# Patient Record
Sex: Female | Born: 1990 | Hispanic: No | Marital: Married | Smoking: Never smoker
Health system: Southern US, Community
[De-identification: ages and names within clinical notes are randomized; demographics above are authoritative.]

## PROBLEM LIST (undated history)

## (undated) DIAGNOSIS — E079 Disorder of thyroid, unspecified: Secondary | ICD-10-CM

## (undated) HISTORY — PX: NO PAST SURGERIES: SHX2092

---

## 2017-12-19 ENCOUNTER — Ambulatory Visit (HOSPITAL_COMMUNITY)
Admission: EM | Admit: 2017-12-19 | Discharge: 2017-12-19 | Disposition: A | Discharge status: Home / Self Care | Payer: Self-pay | Attending: Family Medicine | Admitting: Family Medicine

## 2017-12-19 ENCOUNTER — Other Ambulatory Visit: Payer: Self-pay

## 2017-12-19 ENCOUNTER — Encounter (HOSPITAL_COMMUNITY): Payer: Self-pay | Admitting: Emergency Medicine

## 2017-12-19 DIAGNOSIS — M542 Cervicalgia: Secondary | ICD-10-CM

## 2017-12-19 DIAGNOSIS — M62838 Other muscle spasm: Secondary | ICD-10-CM

## 2017-12-19 MED ORDER — PREDNISONE 10 MG (21) PO TBPK: ORAL_TABLET | Freq: Every day | ORAL | 0 refills | Status: DC

## 2017-12-19 NOTE — ED Triage Notes (Signed)
Per pt, c/o L neck pain x6 month, seen a chiropracter without relief. Pain with moving her L arm.

## 2017-12-21 NOTE — ED Provider Notes (Signed)
  [facility]   [id number] 12/19/17 Arrival Time: 1645  ASSESSMENT & PLAN:  1. Neck pain   2. Trapezius muscle spasm     Meds ordered this encounter  Medications  . predniSONE (STERAPRED UNI-PAK 21 TAB) 10 MG (21) TBPK tablet    Sig: Take by mouth daily. Take as directed.    Dispense:  21 tablet    Refill:  0   Continue ibuprofen. Will f/u if not seeing improvement over the next week. Reviewed expectations re: course of current medical issues. Questions answered. Outlined signs and symptoms indicating need for more acute intervention. Patient verbalized understanding. After Visit Summary given.   SUBJECTIVE: History from: patient. Victoria Montgomery is a 26 y.o. female who reports intermittent pain of her L neck and upper back. Event that precipitated these symptoms: none known. Onset of symptoms 6 months ago; on and off since that time. Current symptoms are stiffness in L neck and upper back. Sometimes worsened by movement of LUE. No radicular symptoms. No muscle weakness. Ibuprofen and chiropractic care with mild and temporary help. No trauma/injury reported. No h/o previous back or neck pain.  ROS: As per HPI.   OBJECTIVE:  Vitals:   12/19/17 1753  BP: 105/72  Pulse: 79  Resp: 18  Temp: 97.9 F (36.6 C)  SpO2: 99%    General appearance: alert; no distress Extremities: no cyanosis or edema; symmetrical with no gross deformities Neck: mild tenderness over L neck extending into trapezius muscle; no gross deformities; FROM; no midline tenderness CV: normal extremity capillary refill Skin: warm and dry Neurologic: normal gait; normal symmetric reflexes in all extremities; normal sensation in all extremities Psychological: alert and cooperative; normal mood and affect   No Known Allergies    Social History   Socioeconomic History  . Marital status: Married    Spouse name: Not on file  . Number of children: Not on file  . Years of education: Not on  file  . Highest education level: Not on file  Social Needs  . Financial resource strain: Not on file  . Food insecurity - worry: Not on file  . Food insecurity - inability: Not on file  . Transportation needs - medical: Not on file  . Transportation needs - non-medical: Not on file  Occupational History  . Not on file  Tobacco Use  . Smoking status: Not on file  Substance and Sexual Activity  . Alcohol use: Not on file  . Drug use: Not on file  . Sexual activity: Not on file  Other Topics Concern  . Not on file  Social History Narrative  . Not on file      Mardella LaymanHagler, Gracia Saggese, MD 12/22/17 [phone]

## 2019-07-13 ENCOUNTER — Encounter: Payer: Self-pay | Admitting: Obstetrics

## 2019-07-13 ENCOUNTER — Ambulatory Visit: Payer: BLUE CROSS/BLUE SHIELD | Admitting: Obstetrics and Gynecology

## 2019-07-13 ENCOUNTER — Other Ambulatory Visit: Payer: Self-pay

## 2019-07-13 VITALS — BP 103/68 | HR 87 | Temp 98.5°F | Ht 59.0 in | Wt 113.2 lb

## 2019-07-13 DIAGNOSIS — Z124 Encounter for screening for malignant neoplasm of cervix: Secondary | ICD-10-CM | POA: Diagnosis not present

## 2019-07-13 DIAGNOSIS — Z01419 Encounter for gynecological examination (general) (routine) without abnormal findings: Secondary | ICD-10-CM | POA: Diagnosis not present

## 2019-07-13 NOTE — Progress Notes (Signed)
Subjective:     Victoria Montgomery is a 28 y.o. female P0 with BMI 22 who is here for a comprehensive physical exam. The patient reports no problems. Patient is sexually active seeking pregnancy. Patient reports actively trying to conceive for the past 11 months without success. Patient describes a monthly 5 day cycle. She also reports some urinary frequency. Patient denies pelvic pain or abnormal discharge  History reviewed. No pertinent past medical history. History reviewed. No pertinent surgical history. Family History  Problem Relation Age of Onset  . Diabetes Mother     Social History   Socioeconomic History  . Marital status: Married    Spouse name: Not on file  . Number of children: Not on file  . Years of education: Not on file  . Highest education level: Not on file  Occupational History  . Occupation: Unemployed  Social Needs  . Financial resource strain: Not on file  . Food insecurity    Worry: Not on file    Inability: Not on file  . Transportation needs    Medical: Not on file    Non-medical: Not on file  Tobacco Use  . Smoking status: Never Smoker  . Smokeless tobacco: Never Used  Substance and Sexual Activity  . Alcohol use: Never    Frequency: Never  . Drug use: Never  . Sexual activity: Yes    Birth control/protection: None  Lifestyle  . Physical activity    Days per week: Not on file    Minutes per session: Not on file  . Stress: Not on file  Relationships  . Social Musicianconnections    Talks on phone: Not on file    Gets together: Not on file    Attends religious service: Not on file    Active member of club or organization: Not on file    Attends meetings of clubs or organizations: Not on file    Relationship status: Not on file  . Intimate partner violence    Fear of current or ex partner: Not on file    Emotionally abused: Not on file    Physically abused: Not on file    Forced sexual activity: Not on file  Other Topics Concern  . Not on file   Social History Narrative  . Not on file   Health Maintenance  Topic Date Due  . HIV Screening  10/27/2008  . TETANUS/TDAP  10/27/2012  . PAP-Cervical Cytology Screening  10/27/2014  . PAP SMEAR-Modifier  10/27/2014  . INFLUENZA VACCINE  07/23/2019       Review of Systems Pertinent items are noted in HPI.   Objective:  Blood pressure 103/68, pulse 87, temperature 98.5 F (36.9 C), height 4\' 11"  (1.499 m), weight 113 lb 3.2 oz (51.3 kg), last menstrual period 07/06/2019.     GENERAL: Well-developed, well-nourished female in no acute distress.  HEENT: Normocephalic, atraumatic. Sclerae anicteric.  NECK: Supple. Normal thyroid.  LUNGS: Clear to auscultation bilaterally.  HEART: Regular rate and rhythm. BREASTS: Symmetric in size. No palpable masses or lymphadenopathy, skin changes, or nipple drainage. ABDOMEN: Soft, nontender, nondistended. No organomegaly. PELVIC: Normal external female genitalia. Vagina is pink and rugated.  Normal discharge. Normal appearing cervix. Uterus is normal in size. No adnexal mass or tenderness. EXTREMITIES: No cyanosis, clubbing, or edema, 2+ distal pulses.    Assessment:    Healthy female exam.      Plan:    Pap smear collected Urine culture collected Pelvic ultrasound ordered Patient will be  contacted with abnormal results Discussed and reviewed timing of ovulation with intercourse and the use of ovulation predictor kits See After Visit Summary for Counseling Recommendations

## 2019-07-13 NOTE — Progress Notes (Signed)
New GYN presents for AEX/PAP and infertility problems.  C/o trying to conceive x 11 months, she has a period every month and has never been on [facility].  This is her first PAP Smear.  Her spouse has not have a Sperm Count Test done.

## 2019-07-14 LAB — CYTOLOGY - PAP: Diagnosis: NEGATIVE

## 2019-07-15 LAB — URINE CULTURE

## 2019-07-15 MED ORDER — PENICILLIN V POTASSIUM 500 MG PO TABS: 500.0000 mg | ORAL_TABLET | Freq: Four times a day (QID) | ORAL | 0 refills | Status: DC

## 2019-07-15 NOTE — Addendum Note (Signed)
Addended by: Mora Bellman on: 07/15/2019 10:02 AM   Modules accepted: Orders

## 2019-07-22 ENCOUNTER — Other Ambulatory Visit: Payer: BLUE CROSS/BLUE SHIELD

## 2019-07-27 ENCOUNTER — Ambulatory Visit
Admission: RE | Admit: 2019-07-27 | Discharge: 2019-07-27 | Disposition: A | Discharge status: Home / Self Care | Payer: BLUE CROSS/BLUE SHIELD | Source: Ambulatory Visit | Attending: Obstetrics and Gynecology | Admitting: Obstetrics and Gynecology

## 2019-07-27 DIAGNOSIS — Z01419 Encounter for gynecological examination (general) (routine) without abnormal findings: Secondary | ICD-10-CM

## 2019-09-21 ENCOUNTER — Ambulatory Visit (INDEPENDENT_AMBULATORY_CARE_PROVIDER_SITE_OTHER): Payer: BLUE CROSS/BLUE SHIELD

## 2019-09-21 ENCOUNTER — Other Ambulatory Visit: Payer: Self-pay

## 2019-09-21 DIAGNOSIS — Z3401 Encounter for supervision of normal first pregnancy, first trimester: Secondary | ICD-10-CM | POA: Diagnosis not present

## 2019-09-21 DIAGNOSIS — Z3400 Encounter for supervision of normal first pregnancy, unspecified trimester: Secondary | ICD-10-CM | POA: Insufficient documentation

## 2019-09-21 LAB — POCT URINE PREGNANCY: Preg Test, Ur: POSITIVE — AB

## 2019-09-21 MED ORDER — BLOOD PRESSURE KIT DEVI: 1.0000 | 0 refills | Status: DC

## 2019-09-21 NOTE — Progress Notes (Signed)
PRENATAL INTAKE SUMMARY  Ms. Younce presents today New OB Nurse Interview.  OB History    Gravida  1   Para  0   Term  0   Preterm  0   AB  0   Living  0     SAB  0   TAB  0   Ectopic  0   Multiple  0   Live Births  0          I have reviewed the patient's medical, obstetrical, social, and family histories, medications, and available lab results.  SUBJECTIVE She has no unusual complaints and complains of abdominal pain in the pelvic area, hair loss and itching.  OBJECTIVE Initial Physical Exam (New OB)  GENERAL APPEARANCE: alert, well appearing   ASSESSMENT Normal pregnancy  PLAN Prenatal care at [facility].  NOB appt scheduled.

## 2019-09-22 ENCOUNTER — Telehealth: Payer: Self-pay

## 2019-09-22 ENCOUNTER — Other Ambulatory Visit: Payer: Self-pay

## 2019-09-22 ENCOUNTER — Encounter (HOSPITAL_COMMUNITY): Payer: Self-pay | Admitting: *Deleted

## 2019-09-22 ENCOUNTER — Inpatient Hospital Stay (HOSPITAL_COMMUNITY)
Admission: AD | Admit: 2019-09-22 | Discharge: 2019-09-22 | Disposition: A | Discharge status: Home / Self Care | Payer: BLUE CROSS/BLUE SHIELD | Attending: Obstetrics & Gynecology | Admitting: Obstetrics & Gynecology

## 2019-09-22 ENCOUNTER — Inpatient Hospital Stay (HOSPITAL_COMMUNITY): Payer: BLUE CROSS/BLUE SHIELD

## 2019-09-22 DIAGNOSIS — Z3A01 Less than 8 weeks gestation of pregnancy: Secondary | ICD-10-CM | POA: Insufficient documentation

## 2019-09-22 DIAGNOSIS — O98811 Other maternal infectious and parasitic diseases complicating pregnancy, first trimester: Secondary | ICD-10-CM | POA: Insufficient documentation

## 2019-09-22 DIAGNOSIS — B3731 Acute candidiasis of vulva and vagina: Secondary | ICD-10-CM

## 2019-09-22 DIAGNOSIS — O98812 Other maternal infectious and parasitic diseases complicating pregnancy, second trimester: Secondary | ICD-10-CM

## 2019-09-22 DIAGNOSIS — M545 Low back pain: Secondary | ICD-10-CM | POA: Diagnosis present

## 2019-09-22 DIAGNOSIS — B373 Candidiasis of vulva and vagina: Secondary | ICD-10-CM | POA: Diagnosis not present

## 2019-09-22 DIAGNOSIS — R109 Unspecified abdominal pain: Secondary | ICD-10-CM | POA: Diagnosis not present

## 2019-09-22 LAB — COMPREHENSIVE METABOLIC PANEL
ALT: 16 U/L (ref 0–44)
AST: 15 U/L (ref 15–41)
Albumin: 4 g/dL (ref 3.5–5.0)
Alkaline Phosphatase: 61 U/L (ref 38–126)
Anion gap: 8 (ref 5–15)
BUN: 5 mg/dL — ABNORMAL LOW (ref 6–20)
CO2: 24 mmol/L (ref 22–32)
Calcium: 9.3 mg/dL (ref 8.9–10.3)
Chloride: 105 mmol/L (ref 98–111)
Creatinine, Ser: 0.66 mg/dL (ref 0.44–1.00)
GFR calc Af Amer: >60 mL/min (ref >60)
GFR calc non Af Amer: >60 mL/min (ref >60)
Glucose, Bld: 95 mg/dL (ref 70–99)
Potassium: 3.8 mmol/L (ref 3.5–5.1)
Sodium: 137 mmol/L (ref 135–145)
Total Bilirubin: 0.4 mg/dL (ref 0.3–1.2)
Total Protein: 7.2 g/dL (ref 6.5–8.1)

## 2019-09-22 LAB — URINALYSIS, ROUTINE W REFLEX MICROSCOPIC
Bacteria, UA: NONE SEEN
Bilirubin Urine: NEGATIVE
Glucose, UA: NEGATIVE mg/dL
Hgb urine dipstick: NEGATIVE
Ketones, ur: NEGATIVE mg/dL
Nitrite: NEGATIVE
Protein, ur: NEGATIVE mg/dL
Specific Gravity, Urine: 1.009 (ref 1.005–1.030)
pH: 8 (ref 5.0–8.0)

## 2019-09-22 LAB — CBC
HCT: 38.8 % (ref 36.0–46.0)
Hemoglobin: 12.7 g/dL (ref 12.0–15.0)
MCH: 26.1 pg (ref 26.0–34.0)
MCHC: 32.7 g/dL (ref 30.0–36.0)
MCV: 79.7 fL — ABNORMAL LOW (ref 80.0–100.0)
Platelets: 358 10*3/uL (ref 150–400)
RBC: 4.87 MIL/uL (ref 3.87–5.11)
RDW: 13.8 % (ref 11.5–15.5)
WBC: 9.8 10*3/uL (ref 4.0–10.5)
nRBC: 0 % (ref 0.0–0.2)

## 2019-09-22 LAB — WET PREP, GENITAL
Clue Cells Wet Prep HPF POC: NONE SEEN
Sperm: NONE SEEN
Trich, Wet Prep: NONE SEEN

## 2019-09-22 LAB — ABO/RH: ABO/RH(D): B POS

## 2019-09-22 LAB — HCG, QUANTITATIVE, PREGNANCY: hCG, Beta Chain, Quant, S: 2481 m[IU]/mL — ABNORMAL HIGH (ref <5)

## 2019-09-22 MED ORDER — CYCLOBENZAPRINE HCL 10 MG PO TABS: 10.0000 mg | ORAL_TABLET | Freq: Once | ORAL | Status: DC

## 2019-09-22 MED ORDER — CYCLOBENZAPRINE HCL 10 MG PO TABS: 10.0000 mg | ORAL_TABLET | Freq: Two times a day (BID) | ORAL | 0 refills | Status: DC | PRN

## 2019-09-22 MED ORDER — BLOOD PRESSURE KIT DEVI: 1.0000 | 0 refills | Status: DC

## 2019-09-22 MED ORDER — TERCONAZOLE 0.4 % VA CREA: 1.0000 | TOPICAL_CREAM | Freq: Every day | VAGINAL | 0 refills | Status: DC

## 2019-09-22 NOTE — MAU Provider Note (Addendum)
History    Patient Victoria Montgomery is a 28 y.o. G1P0000 At 45w4dhere with complaints of low back pain for a week and two drops of blood on her toilet paper yesterday.   She denies cramping, NV, SOB, chest pain, fever, fatigue or heavy vaginal bleeding.  She states that back pain is her main complaint; it is a 7/10. She only came in because she had some spotting too and she thought she might be having a miscarriag. The back pain plus the spotting made her very upset and want to come to MAU.  CSN: [id number] Arrival date and time: 09/22/19 1149   None     Chief Complaint  Patient presents with  . Vaginal Bleeding  . Abdominal Pain  . Back Pain  . Vaginal Itching   Vaginal Bleeding The patient's primary symptoms include vaginal bleeding. The patient's pertinent negatives include no vaginal discharge. This is a new problem. The current episode started yesterday. The problem has been resolved. She is pregnant. Associated symptoms include back pain. Pertinent negatives include no abdominal pain, dysuria, urgency or vomiting. The vaginal bleeding is spotting. She has not been passing clots. She has not been passing tissue. Nothing aggravates the symptoms. She has tried nothing for the symptoms.  She also endorses low back pain that has been going on for a week but was made much worse by a long car ride two days ago[place] Ever since then she has felt sore. She denies pain with urination, blood in urine, urgency, frequency. She has tried Tylenol but it has not helped.   OB History    Gravida  1   Para  0   Term  0   Preterm  0   AB  0   Living  0     SAB  0   TAB  0   Ectopic  0   Multiple  0   Live Births  0           History reviewed. No pertinent past medical history.  Past Surgical History:  Procedure Laterality Date  . NO PAST SURGERIES      Family History  Problem Relation Age of Onset  . Diabetes Mother      Social History   Tobacco Use  . Smoking status: Never Smoker  . Smokeless tobacco: Never Used  Substance Use Topics  . Alcohol use: Never    Frequency: Never  . Drug use: Never    Allergies: No Known Allergies  Medications Prior to Admission  Medication Sig Dispense Refill Last Dose  . Prenatal Vit-Fe Fumarate-FA (PRENATAL MULTIVITAMIN) TABS tablet Take 1 tablet by mouth daily at 12 noon.     . Blood Pressure Monitoring (BLOOD PRESSURE KIT) DEVI 1 kit by Does not apply route once a week. Check BP Weekly. Large cuff  EX O090.0 1 kit 0   . Multiple Vitamin (MULTIVITAMIN) capsule Take by mouth.     . penicillin v potassium (VEETID) 500 MG tablet Take 1 tablet (500 mg total) by mouth 4 (four) times daily. (Patient not taking: Reported on 09/21/2019) 28 tablet 0   . predniSONE (STERAPRED UNI-PAK 21 TAB) 10 MG (21) TBPK tablet Take by mouth daily. Take as directed. (Patient not taking: Reported on 07/13/2019) 21 tablet 0     Review of Systems  Constitutional: Negative.   HENT: Negative.   Respiratory: Negative.   Gastrointestinal: Negative for abdominal  pain and vomiting.  Genitourinary: Positive for vaginal bleeding. Negative for decreased urine volume, difficulty urinating, dyspareunia, dysuria, urgency, vaginal discharge and vaginal pain.  Musculoskeletal: Positive for back pain.   Physical Exam   Blood pressure 100/66, pulse 99, temperature 98.2 F (36.8 C), temperature source Oral, resp. rate 16, height _0  (1.473 m), weight 51.8 kg, last menstrual period 08/08/2019, SpO2 100 %.  Physical Exam  Constitutional: She is oriented to person, place, and time. She appears well-developed.  HENT:  Head: Normocephalic.  Eyes: Pupils are equal, round, and reactive to light.  Neck: Normal range of motion.  Respiratory: Effort normal.  GI: Soft.  Genitourinary:    Vagina normal.     Genitourinary Comments: NEFG; no blood in discharge. Clumpy white discharge in the vagina, no CMT,  suprapubic or adnexal tenderness.    Musculoskeletal: Normal range of motion.  Neurological: She is alert and oriented to person, place, and time.  Skin: Skin is warm and dry.    MAU Course  Procedures  MDM -ectopic rule out done -Blood type is B positive -US shows gestational sac but no FP or yolk sac yet -discharge in vagina consistent with yeast -UA is clear; unlikely that back pain is due to UTI, most likely MSK and from long car ride.   -beta is 2500.  Assessment and Plan   1. Vaginal yeast infection   2. Abdominal pain    2. Reviewed that back pain is most likely MSK; offered Flexeril in MAU but patient declined, she wants to be discharged and take at home. Will send home with RX for Terazol as well.   3. Explained her [place] results in detail with patient and sister-in- law; she understands that we are still concerned about an ectopic pregnancy and that we want to make sure her pregnancy is not in her tubes. She will return on Saturday afternoon (48 hours) for a follow up [place].   4. Strict ectopic precautions were given; patient and sister-in-law agreed to come back if any increase in pain or bleeding.  Mervyn Skeeters Izear Pine 09/22/2019, 4:41 PM

## 2019-09-22 NOTE — Telephone Encounter (Signed)
Patient called triage line during lunch yesterday and states that she has been having spotting, lower abdominal pain, and lower back pain for the past 2 days. She denies having any dysuria, urinary frequency. Patient states that she is very nervous due to this being her first pregnancy. Patient advised to go to MAU to be evaluated to make sure that there is nothing else going on.

## 2019-09-22 NOTE — Discharge Instructions (Signed)
-  Come back on Saturday, October 3, after 2 pm for repeat blood work. Return to MAU if you develop strong, constant abdominal pain or heavy bleeding.  Spotting is ok, and back pain is normal right now.    Ectopic Pregnancy  An ectopic pregnancy happens when a fertilized egg grows outside the womb (uterus). The fertilized egg cannot stay alive outside of the womb. This problem often happens in a fallopian tube. It is often caused by damage to the tube. If this problem is found early, you may be treated with medicine that stops the egg from growing. If your tube tears or bursts open (ruptures), you will bleed inside. Often, there is very bad pain in the lower belly. This is an emergency. You will need surgery. Get help right away. Follow these instructions at home: After being treated with medicine or surgery:  Rest and limit your activity for as long as told by your doctor.  Until your doctor says that it is safe: ? Do not lift anything that is heavier than 10 lb (4.5 kg) or the limit that your doctor tells you. ? Avoid exercise and any movement that takes a lot of effort.  To prevent problems when pooping (constipation): ? Eat a healthy diet. This includes:  Fruits.  Vegetables.  Whole grains. ? Drink 6-8 glasses of water a day. Contact a doctor if: Get help right away if:  You have sudden and very bad pain in your belly.  You have very bad pain in your shoulders or neck.  You have pain that gets worse and is not helped by medicine.  You have: ? A fever or chills. ? Vaginal bleeding. ? Redness or swelling at the site of a surgical cut (incision).  You feel sick to your stomach (nauseous) or you throw up (vomit).  You feel dizzy or weak.  You feel light-headed or you pass out (faint). Summary  An ectopic pregnancy happens when a fertilized egg grows outside the womb (uterus).  If this problem is found early, you may be treated with medicine that stops the egg from  growing.  If your tube tears or bursts open (ruptures), you will need surgery. This is an emergency. Get help right away. This information is not intended to replace advice given to you by your health care provider. Make sure you discuss any questions you have with your health care provider. Document Released: 03/06/2009 Document Revised: 11/20/2017 Document Reviewed: 01/01/2017 Elsevier Patient Education  2020 [employer].

## 2019-09-22 NOTE — MAU Note (Signed)
Having too much back pain.  Been going on for 36 hrs.  Saw 2 spots of blood yesterday.  Has some vaginal itching sometimes and some pain in the lower abd.

## 2019-09-22 NOTE — Addendum Note (Signed)
Addended by: Tamela Oddi on: 09/22/2019 10:03 AM   Modules accepted: Orders

## 2019-09-23 ENCOUNTER — Other Ambulatory Visit: Payer: Self-pay | Admitting: Student

## 2019-09-23 LAB — GC/CHLAMYDIA PROBE AMP (~~LOC~~) NOT AT ARMC
Chlamydia: NEGATIVE
Neisseria Gonorrhea: NEGATIVE

## 2019-09-24 ENCOUNTER — Inpatient Hospital Stay (HOSPITAL_COMMUNITY)
Admission: AD | Admit: 2019-09-24 | Discharge: 2019-09-24 | Disposition: A | Discharge status: Home / Self Care | Payer: BLUE CROSS/BLUE SHIELD | Attending: Obstetrics and Gynecology | Admitting: Obstetrics and Gynecology

## 2019-09-24 ENCOUNTER — Other Ambulatory Visit: Payer: Self-pay

## 2019-09-24 DIAGNOSIS — O009 Unspecified ectopic pregnancy without intrauterine pregnancy: Secondary | ICD-10-CM | POA: Diagnosis present

## 2019-09-24 DIAGNOSIS — Z3A01 Less than 8 weeks gestation of pregnancy: Secondary | ICD-10-CM | POA: Insufficient documentation

## 2019-09-24 LAB — CBC WITH DIFFERENTIAL/PLATELET
Abs Immature Granulocytes: 0.04 10*3/uL (ref 0.00–0.07)
Basophils Absolute: 0 10*3/uL (ref 0.0–0.1)
Basophils Relative: 0 %
Eosinophils Absolute: 0.2 10*3/uL (ref 0.0–0.5)
Eosinophils Relative: 2 %
HCT: 37.8 % (ref 36.0–46.0)
Hemoglobin: 12.3 g/dL (ref 12.0–15.0)
Immature Granulocytes: 0 %
Lymphocytes Relative: 32 %
Lymphs Abs: 3.1 10*3/uL (ref 0.7–4.0)
MCH: 26.3 pg (ref 26.0–34.0)
MCHC: 32.5 g/dL (ref 30.0–36.0)
MCV: 80.8 fL (ref 80.0–100.0)
Monocytes Absolute: 0.5 10*3/uL (ref 0.1–1.0)
Monocytes Relative: 5 %
Neutro Abs: 5.8 10*3/uL (ref 1.7–7.7)
Neutrophils Relative %: 61 %
Platelets: 346 10*3/uL (ref 150–400)
RBC: 4.68 MIL/uL (ref 3.87–5.11)
RDW: 13.8 % (ref 11.5–15.5)
WBC: 9.6 10*3/uL (ref 4.0–10.5)
nRBC: 0 % (ref 0.0–0.2)

## 2019-09-24 LAB — COMPREHENSIVE METABOLIC PANEL
ALT: 16 U/L (ref 0–44)
AST: 18 U/L (ref 15–41)
Albumin: 3.8 g/dL (ref 3.5–5.0)
Alkaline Phosphatase: 60 U/L (ref 38–126)
Anion gap: 11 (ref 5–15)
BUN: 5 mg/dL — ABNORMAL LOW (ref 6–20)
CO2: 22 mmol/L (ref 22–32)
Calcium: 8.8 mg/dL — ABNORMAL LOW (ref 8.9–10.3)
Chloride: 101 mmol/L (ref 98–111)
Creatinine, Ser: 0.59 mg/dL (ref 0.44–1.00)
GFR calc Af Amer: >60 mL/min (ref >60)
GFR calc non Af Amer: >60 mL/min (ref >60)
Glucose, Bld: 115 mg/dL — ABNORMAL HIGH (ref 70–99)
Potassium: 3.5 mmol/L (ref 3.5–5.1)
Sodium: 134 mmol/L — ABNORMAL LOW (ref 135–145)
Total Bilirubin: 0.6 mg/dL (ref 0.3–1.2)
Total Protein: 6.9 g/dL (ref 6.5–8.1)

## 2019-09-24 LAB — HCG, QUANTITATIVE, PREGNANCY: hCG, Beta Chain, Quant, S: 2494 m[IU]/mL — ABNORMAL HIGH (ref <5)

## 2019-09-24 MED ORDER — METHOTREXATE FOR ECTOPIC PREGNANCY
50.0000 mg/m2 | Freq: Once | INTRAMUSCULAR | Status: AC
  Administered 2019-09-24: 20:00:00 75 mg via INTRAMUSCULAR
  Filled 2019-09-24: qty 1

## 2019-09-24 NOTE — Discharge Instructions (Signed)
Methotrexate Treatment for an Ectopic Pregnancy  Methotrexate is a medicine that treats an ectopic pregnancy. An ectopic pregnancy is a pregnancy in which the fetus develops outside the uterus. This kind of pregnancy can be dangerous. Methotrexate works by stopping the growth of the fertilized egg. It also helps your body absorb tissue from the egg. This takes between 2-6 weeks. Most ectopic pregnancies can be successfully treated with methotrexate if they are diagnosed early. Tell a health care provider about:  Any allergies you have.  All medicines you are taking, including vitamins, herbs, eye drops, creams, and over-the-counter medicines.  Any medical conditions you have. What are the risks? Generally, this is a safe treatment. However, problems may occur, including:  Nausea or vomiting or both.  Vaginal bleeding or spotting.  Diarrhea.  Abdominal cramping.  Dizziness or feeling lightheaded.  Mouth sores.  Swelling or irritation of the lining of your lungs (pneumonitis).  Liver damage.  Hair loss. There is a risk that methotrexate treatment will fail and your pregnancy will continue. There is also a risk that the ectopic pregnancy might rupture while you are using this medicine. What happens before the procedure?  Liver tests, kidney tests, and a complete blood test will be done.  Blood tests will be done to measure the pregnancy hormone levels and to determine your blood type.  If you are Rh-negative and the father is Rh-positive or his Rh type is not known, you will be given a Rho (D) immune globulin shot. What happens during the procedure? Your health care provider may give you methotrexate by injection or in the form of a pill. Methotrexate may be given as a single dose of medicine or a series of doses, depending on your response to the treatment.  Methotrexate injections will be given by your health care provider. This is the most common way that methotrexate is used  to treat an ectopic pregnancy.  If you are prescribed oral methotrexate, it is very important that you follow your health care provider's instructions on how to take oral methotrexate. Additional medicines may be needed to manage an ectopic pregnancy. The procedure may vary among health care providers and hospitals. What happens after the procedure?  You may have abdominal cramping, vaginal bleeding, and fatigue.  Blood tests will be taken at timed intervals for several days or weeks to check your pregnancy hormone levels. The blood tests will be done until the pregnancy hormone can no longer be detected in the blood.  You may need to have a surgical procedure to remove the ectopic pregnancy if methotrexate treatment fails.  Follow instructions from your health care provider on how and when to report any symptoms that may indicate a ruptured ectopic pregnancy. Summary  Methotrexate is a medicine that treats an ectopic pregnancy.  Methotrexate may be given in a single dose or a series of doses over time.  Blood tests will be taken at timed intervals for several days or weeks to check your pregnancy hormone levels. The blood tests will be done until no more pregnancy hormone is detected in the blood.  There is a risk that methotrexate treatment will fail and your pregnancy will continue. There is also a risk that the ectopic pregnancy might rupture while you are using this medicine. This information is not intended to replace advice given to you by your health care provider. Make sure you discuss any questions you have with your health care provider. Document Released: 12/02/2001 Document Revised: 11/20/2017 Document Reviewed:  01/27/2017 Elsevier Patient Education  [place].     Methotrexate Treatment for an Ectopic Pregnancy, Care After This sheet gives you information about how to care for yourself after your procedure. Your health care provider may also give you more  specific instructions. If you have problems or questions, contact your health care provider. What can I expect after the procedure? After the procedure, it is common to have:  Abdominal cramping.  Vaginal bleeding.  Fatigue.  Nausea.  Vomiting.  Diarrhea. Blood tests will be taken at timed intervals for several days or weeks to check your pregnancy hormone levels. The blood tests will be done until the pregnancy hormone can no longer be detected in the blood. Follow these instructions at home: Activity  Do not have sex until your health care provider approves.  Limit activities that take a lot of effort as told by your health care provider. Medicines  Take over the counter and prescription medicines only as told by your health care provider.  Do not take aspirin, ibuprofen, naproxen, or any other NSAIDs.  Do not take folic acid, prenatal vitamins, or other vitamins that contain folic acid. General instructions   Do not drink alcohol.  Follow instructions from your health care provider on how and when to report any symptoms that may indicate a ruptured ectopic pregnancy.  Keep all follow-up visits as told by your health care provider. This is important. Contact a health care provider if:  You have persistent nausea and vomiting.  You have persistent diarrhea.  You are having a reaction to the medicine, such as: ? Tiredness. ? Skin rash. ? Hair loss. Get help right away if:  Your abdominal or pelvic pain gets worse.  You have more vaginal bleeding.  You feel light-headed or you faint.  You have shortness of breath.  Your heart rate increases.  You develop a cough.  You have chills.  You have a fever. Summary  After the procedure, it is common to have symptoms of abdominal cramping, vaginal bleeding and fatigue. You may also experience other symptoms.  Blood tests will be taken at timed intervals for several days or weeks to check your pregnancy  hormone levels. The blood tests will be done until the pregnancy hormone can no longer be detected in the blood.  Limit strenuous activity as told by your health care provider.  Follow instructions from your health care provider on how and when to report any symptoms that may indicate a ruptured ectopic pregnancy. This information is not intended to replace advice given to you by your health care provider. Make sure you discuss any questions you have with your health care provider. Document Released: 11/27/2011 Document Revised: 11/20/2017 Document Reviewed: 01/27/2017 Elsevier Patient Education  2020 [employer].

## 2019-09-24 NOTE — MAU Note (Signed)
Victoria Montgomery is a 28 y.o. at [redacted]w[redacted]d here in MAU reporting: here for follow up hcg. Today she saw 2 spots of blood. Is having some back pain, is about the same as when she was here the other day.  Pain score: 6/10  Vitals:   09/24/19 1445  BP: 102/60  Pulse: 96  Resp: 17  Temp: 98.1 F (36.7 C)  SpO2: 100%      Lab orders placed from triage: hcg

## 2019-09-24 NOTE — MAU Note (Signed)
Pt and SO come to desk and state they are waiting for labs still. This RN informed pt and SO that CMP is still pending. Pt and SO states that they will "be back in some time"

## 2019-09-24 NOTE — MAU Provider Note (Signed)
Subjective:  Victoria Montgomery is a 28 y.o. G1P0000 at [redacted]w[redacted]d who presents today for FU BHCG. She was seen on 09/22/2019. Results from that day show no IUP on [place], and HCG 2,481. She denies vaginal bleeding. She denies abdominal or pelvic pain.  Objective:  Physical Exam  Nursing note and vitals reviewed. Patient Vitals for the past 24 hrs:  BP Temp Temp src Pulse Resp SpO2  09/24/19 1445 102/60 98.1 F (36.7 C) Oral 96 17 100 %   Constitutional: She is oriented to person, place, and time. She appears well-developed and well-nourished. No distress.  HENT:  Head: Normocephalic.  Cardiovascular: Normal rate.  Respiratory: Effort normal.  GI: Soft. There is no tenderness.  Neurological: She is alert and oriented to person, place, and time. Skin: Skin is warm and dry.  Psychiatric: She has a normal mood and affect.   Results for orders placed or performed during the hospital encounter of 09/24/19 (from the past 24 hour(s))  hCG, quantitative, pregnancy     Status: Abnormal   Collection Time: 09/24/19  3:01 PM  Result Value Ref Range   hCG, Beta Chain, Quant, S 2,494 (H) <5 mIU/mL  Comprehensive metabolic panel     Status: Abnormal   Collection Time: 09/24/19  3:01 PM  Result Value Ref Range   Sodium 134 (L) 135 - 145 mmol/L   Potassium 3.5 3.5 - 5.1 mmol/L   Chloride 101 98 - 111 mmol/L   CO2 22 22 - 32 mmol/L   Glucose, Bld 115 (H) 70 - 99 mg/dL   BUN 5 (L) 6 - 20 mg/dL   Creatinine, Ser 4.08 0.44 - 1.00 mg/dL   Calcium 8.8 (L) 8.9 - 10.3 mg/dL   Total Protein 6.9 6.5 - 8.1 g/dL   Albumin 3.8 3.5 - 5.0 g/dL   AST 18 15 - 41 U/L   ALT 16 0 - 44 U/L   Alkaline Phosphatase 60 38 - 126 U/L   Total Bilirubin 0.6 0.3 - 1.2 mg/dL   GFR calc non Af Amer >60 >60 mL/min   GFR calc Af Amer >60 >60 mL/min   Anion gap 11 5 - 15  CBC with Differential/Platelet     Status: None   Collection Time: 09/24/19  5:13 PM  Result Value Ref Range   WBC 9.6 4.0 - 10.5 K/uL   RBC 4.68 3.87 - 5.11  MIL/uL   Hemoglobin 12.3 12.0 - 15.0 g/dL   HCT 14.4 81.8 - 56.3 %   MCV 80.8 80.0 - 100.0 fL   MCH 26.3 26.0 - 34.0 pg   MCHC 32.5 30.0 - 36.0 g/dL   RDW 14.9 70.2 - 63.7 %   Platelets 346 150 - 400 K/uL   nRBC 0.0 0.0 - 0.2 %   Neutrophils Relative % 61 %   Neutro Abs 5.8 1.7 - 7.7 K/uL   Lymphocytes Relative 32 %   Lymphs Abs 3.1 0.7 - 4.0 K/uL   Monocytes Relative 5 %   Monocytes Absolute 0.5 0.1 - 1.0 K/uL   Eosinophils Relative 2 %   Eosinophils Absolute 0.2 0.0 - 0.5 K/uL   Basophils Relative 0 %   Basophils Absolute 0.0 0.0 - 0.1 K/uL   Immature Granulocytes 0 %   Abs Immature Granulocytes 0.04 0.00 - 0.07 K/uL    Assessment/Plan: -Pregnancy of unknown location -HCG did not rise appropriately -Called and spoke with Dr. Jolayne Panther @1612  to review abnormal rise in hCG and results from 09/22/2019 showing  GS only, but no yolk sac or fetal pole with normal ovaries. Per Dr. Elly Modena, offer MTX. -Discussed with pt MTX as preferred intervention, followed by surgery, followed by expectant management. -Discussed with patient that if expectant management is elected, there is a possibility of rupture of the ectopic pregnancy. Discussed the life-threatening nature of ruptured ectopic pregnancy necessitating emergency surgery and discussed that MTX was preferred treatment modality at this time. Pt states she needs to call her husband to discuss and was escorted back to the waiting room to make her phone call after discussion with provider. -Pt called from waiting room @434PM , no one in lobby, pt did not respond. -Pt called from waiting room @445PM , pt not in lobby. Per front desk, they state the patient was crying and left the waiting room. They anticipated her return, but state she left around 425PM and has not returned. -The front desk called around 450PM stating that the patient had returned to the waiting room with her husband and was now ready to be seen. Discussed the situation in  detail and patient and patient's husband's questions were asked and answered in detail. Pt states she needs to discuss again with her husband and the provider stepped out of the room. -Upon return to the room, patient and husband elect to have the MTX injection today. Pt advised will need additional labs and should be prepared to remain in hospital for approximately 2hrs, but that a specific timeframe cannot be guaranteed. Pt and husband agree they can stay for the duration of the treatment. -notified by RN around 720PM that patient told RN she and her husband would be leaving MAU and return "in a little bit." CMP pending at this time. -pt called from waiting room @733PM  when lab resulted, pt and husband brought to triage room and reviewed below information about MTX injection.  The risks of methotrexate were reviewed including failure requiring repeat dosing or eventual surgery. She understands that methotrexate involves frequent return visits to monitor lab values and that she remains at risk of ectopic rupture until her beta is less than assay. ?The patient opts to proceed with methotrexate.  She has no history of hepatic or renal dysfunction, has normal BUN/Cr/LFT's/platelets.  She is felt to be reliable for follow-up. Side effects of photosensitivity & GI upset were discussed.  She knows to avoid direct sunlight and abstain from alcohol, NSAIDs and sexual intercourse for two weeks. She was counseled to discontinue any MVI with folic acid. ?She understands to follow up on D4 (09/27/2019) and D7 (09/30/2019) for repeat BHCG and was given the instruction sheet. ?Strict ectopic precautions were reviewed, the patient knows to call with any abdominal pain, vomiting, fainting, or any concerns with her health.  Day 0/1 Day 4 Day 7  Sunday Wednesday Saturday  Monday Thursday Sunday  Tuesday Friday Monday  Wednesday Saturday Tuesday  Thursday Sunday Wednesday  Friday Monday Thursday  Saturday Tuesday  Friday    Methotrexate Treatment Protocol for Ectopic Pregnancy  X Pretreatment testing and instructions  X hCG concentration (2,494 - Day 0) X Transvaginal ultrasound - completed 09/22/2019 (Tiny gestational sac is noted but no yolk sac, fetal pole, or cardiac activity yet visualized) X Blood group and Rh(D) typing - B Positive X Complete blood count - WNL X Liver and renal function tests - no abnormalities requiring treatment, serum creatinine 0.59, AST/ALT 18/16  X Discontinue folic acid supplements  X Counsel patient to avoid NSAIDs, recommend acetaminophen if an analgesic is needed  X Advise patient to refrain from sexual intercourse and strenuous exercise  Treatment day  Single dose protocol   1  hCG.  Administer Methotrexate 50 mg/m2 body surface area IM  4  hCG - message sent to [facility] to schedule pt for lab draw Tuesday 09/27/2019  7  [facility] Friday 09/30/2019 If <15 percent hCG decline from day 4 to 7, give additional dose of methotrexate 50 mg/m2 IM  If ?15 percent hCG decline from day 4 to 7, draw hCG weekly until undetectable  14  hCG  If <15 percent hCG decline from day 7 to 14, give additional dose of methotrexate 50 mg/m2 IM  If ?15 percent hCG decline from day 7 to 14, check hCG weekly until undetectable  21 and 28  If 3 doses have been given and there is a <15 percent hCG decline from day 21 to 28, proceed with laparoscopic surgery  Laparoscopy  If severe abdominal pain or an acute abdomen suggestive of tubal rupture occurs If ultrasonography reveals greater than 300 mL pelvic or other intraperitoneal fluid  The hCG concentration usually declines to less than 15 mIU/mL by 35 days postinjection but may take as long as 109 days. If the hCG does not decline to zero, a new pregnancy should be excluded; if the hCG is rising, a transvaginal ultrasound should be performed. Alternatively, some patients have a slow  clearance of serum hCG. If three weekly values are similar, consider an additional dose of MTX (50 mg/m2) not to exceed the recommended maximum of three total doses. This typically accelerates the decline of serum hCG. The risk of gestational trophoblastic disease is low. Folinic acid rescue is not required for women treated with the single-dose protocol, even if multiple doses are ultimately given.   Prepared with data from:   [facility]. Clinical practice. Ectopic pregnancy. Malva Limes Engl J Med 2009; 361:379  American College of Obstetricians and Gynecologists. ACOG Practice Bulletin No. 94: Medical management of ectopic pregnancy. Obstet Gynecol 2008; 161:[id number].  -pt discharged to home in stable condition  Marilla Boddy, Odie SeraNicole E, NP  7:53 PM 09/24/2019

## 2019-09-24 NOTE — MAU Note (Signed)
Provider attempt to call pt into triage, not in lobby

## 2019-09-27 ENCOUNTER — Other Ambulatory Visit: Payer: BLUE CROSS/BLUE SHIELD

## 2019-09-27 ENCOUNTER — Other Ambulatory Visit: Payer: Self-pay

## 2019-09-27 DIAGNOSIS — O009 Unspecified ectopic pregnancy without intrauterine pregnancy: Secondary | ICD-10-CM

## 2019-09-27 LAB — BETA HCG QUANT (REF LAB): hCG Quant: 1746 m[IU]/mL

## 2019-09-28 ENCOUNTER — Telehealth: Payer: Self-pay | Admitting: *Deleted

## 2019-09-28 NOTE — Telephone Encounter (Signed)
Call placed to pt to discuss recent HCG levels.  Pt made aware of result and appropriate decline.  Pt was made aware that she will need to be seen at the [facility] on Friday for her next lab draw and not at our office, per Dr Rip Harbour.  This is due to provider needing faster results and before office hours end.   Pt states understanding and will go to hospital on Friday.

## 2019-09-29 ENCOUNTER — Other Ambulatory Visit: Payer: Self-pay

## 2019-09-29 ENCOUNTER — Inpatient Hospital Stay (HOSPITAL_COMMUNITY): Payer: BLUE CROSS/BLUE SHIELD

## 2019-09-29 ENCOUNTER — Inpatient Hospital Stay (HOSPITAL_COMMUNITY)
Admission: EM | Admit: 2019-09-29 | Discharge: 2019-09-30 | Disposition: A | Discharge status: Home / Self Care | Payer: BLUE CROSS/BLUE SHIELD | Attending: Family Medicine | Admitting: Family Medicine

## 2019-09-29 ENCOUNTER — Encounter (HOSPITAL_COMMUNITY): Payer: Self-pay

## 2019-09-29 DIAGNOSIS — O99891 Other specified diseases and conditions complicating pregnancy: Secondary | ICD-10-CM | POA: Diagnosis present

## 2019-09-29 DIAGNOSIS — Z833 Family history of diabetes mellitus: Secondary | ICD-10-CM | POA: Diagnosis not present

## 2019-09-29 DIAGNOSIS — Z3A01 Less than 8 weeks gestation of pregnancy: Secondary | ICD-10-CM | POA: Insufficient documentation

## 2019-09-29 DIAGNOSIS — Z3A08 8 weeks gestation of pregnancy: Secondary | ICD-10-CM | POA: Diagnosis not present

## 2019-09-29 DIAGNOSIS — O009 Unspecified ectopic pregnancy without intrauterine pregnancy: Secondary | ICD-10-CM | POA: Diagnosis not present

## 2019-09-29 DIAGNOSIS — R1032 Left lower quadrant pain: Secondary | ICD-10-CM | POA: Diagnosis not present

## 2019-09-29 DIAGNOSIS — O26891 Other specified pregnancy related conditions, first trimester: Secondary | ICD-10-CM

## 2019-09-29 DIAGNOSIS — R102 Pelvic and perineal pain: Secondary | ICD-10-CM

## 2019-09-29 DIAGNOSIS — Z79899 Other long term (current) drug therapy: Secondary | ICD-10-CM | POA: Insufficient documentation

## 2019-09-29 LAB — HCG, QUANTITATIVE, PREGNANCY: hCG, Beta Chain, Quant, S: 1550 m[IU]/mL — ABNORMAL HIGH (ref <5)

## 2019-09-29 MED ORDER — OXYCODONE HCL 5 MG PO TABS
5.0000 mg | ORAL_TABLET | Freq: Once | ORAL | Status: DC
  Filled 2019-09-29: qty 1

## 2019-09-29 NOTE — ED Notes (Signed)
Spoke with MAU charge RN, pt to be transported to MAU.

## 2019-09-29 NOTE — Discharge Instructions (Signed)
Methotrexate Treatment for an Ectopic Pregnancy, Care After °This sheet gives you information about how to care for yourself after your procedure. Your health care provider may also give you more specific instructions. If you have problems or questions, contact your health care provider. °What can I expect after the procedure? °After the procedure, it is common to have: °· Abdominal cramping. °· Vaginal bleeding. °· Fatigue. °· Nausea. °· Vomiting. °· Diarrhea. °Blood tests will be taken at timed intervals for several days or weeks to check your pregnancy hormone levels. The blood tests will be done until the pregnancy hormone can no longer be detected in the blood. °Follow these instructions at home: °Activity °· Do not have sex until your health care provider approves. °· Limit activities that take a lot of effort as told by your health care provider. °Medicines °· Take over the counter and prescription medicines only as told by your health care provider. °· Do not take aspirin, ibuprofen, naproxen, or any other NSAIDs. °· Do not take folic acid, prenatal vitamins, or other vitamins that contain folic acid. °General instructions ° °· Do not drink alcohol. °· Follow instructions from your health care provider on how and when to report any symptoms that may indicate a ruptured ectopic pregnancy. °· Keep all follow-up visits as told by your health care provider. This is important. °Contact a health care provider if: °· You have persistent nausea and vomiting. °· You have persistent diarrhea. °· You are having a reaction to the medicine, such as: °? Tiredness. °? Skin rash. °? Hair loss. °Get help right away if: °· Your abdominal or pelvic pain gets worse. °· You have more vaginal bleeding. °· You feel light-headed or you faint. °· You have shortness of breath. °· Your heart rate increases. °· You develop a cough. °· You have chills. °· You have a fever. °Summary °· After the procedure, it is common to have symptoms  of abdominal cramping, vaginal bleeding and fatigue. You may also experience other symptoms. °· Blood tests will be taken at timed intervals for several days or weeks to check your pregnancy hormone levels. The blood tests will be done until the pregnancy hormone can no longer be detected in the blood. °· Limit strenuous activity as told by your health care provider. °· Follow instructions from your health care provider on how and when to report any symptoms that may indicate a ruptured ectopic pregnancy. °This information is not intended to replace advice given to you by your health care provider. Make sure you discuss any questions you have with your health care provider. °Document Released: 11/27/2011 Document Revised: 11/20/2017 Document Reviewed: 01/27/2017 °Elsevier Patient Education © 2020 Elsevier Inc. ° °

## 2019-09-29 NOTE — MAU Note (Signed)
Had MTX last Friday. Started having abd pain about5 3hrs ago with spotting. Pt very uncomfortable and crying in Triage

## 2019-09-29 NOTE — MAU Provider Note (Addendum)
History     CSN: [id number]  Arrival date and time: 09/29/19 1840   First Provider Initiated Contact with Patient 09/29/19 1954      Chief Complaint  Patient presents with  . Abdominal Pain   HPI Victoria Montgomery is a 28 y.o. G1P0000 patient who presents to MAU with chief complaint of left lower quadrant pain and heavy vaginal bleeding. She is s/p Methotrexate administration on 09/24/19.  Patient endorses severe LLQ pain, new onset, which began about 3 hours ago. She rates her pain as 8-9/10. Her pain radiates to her low back. She took 325 mg Tylenol shortly after onset of pain but did not experience relief. She denies aggravating or alleviating factors.  Patient endorses small amount amount of vaginal bleeding the evening after her Methotrexate administration then experienced a recurrence of vaginal bleeding this afternoon. She states she was told she would start her period shortly after Methotrexate and she is wondering if this is her menstrual cycle returning.  Patient denies abdominal tenderness, dizziness, palpitations, fever , chills, and weakness.  OB History    Gravida  1   Para  0   Term  0   Preterm  0   AB  0   Living  0     SAB  0   TAB  0   Ectopic  0   Multiple  0   Live Births  0           History reviewed. No pertinent past medical history.  Past Surgical History:  Procedure Laterality Date  . NO PAST SURGERIES      Family History  Problem Relation Age of Onset  . Diabetes Mother    Social History   Tobacco Use  . Smoking status: Never Smoker  . Smokeless tobacco: Never Used  Substance Use Topics  . Alcohol use: Never    Frequency: Never  . Drug use: Never    Allergies: No Known Allergies  Medications Prior to Admission  Medication Sig Dispense Refill Last Dose  . acetaminophen (TYLENOL) 500 MG tablet Take 500 mg by mouth every 6 (six) hours as needed.   09/29/2019 at 1700  . Blood Pressure Monitoring (BLOOD PRESSURE KIT)  DEVI 1 kit by Does not apply route once a week. Check BP Weekly. Large cuff  EX O090.0 1 kit 0   . terconazole (TERAZOL 7) 0.4 % vaginal cream Place 1 applicator vaginally at bedtime. 45 g 0     Review of Systems  Constitutional: Negative for chills, fatigue and fever.  Respiratory: Negative for shortness of breath.   Gastrointestinal: Positive for abdominal pain.  Genitourinary: Positive for vaginal bleeding. Negative for difficulty urinating and dysuria.  Musculoskeletal: Positive for back pain.  Neurological: Negative for dizziness, syncope and weakness.  All other systems reviewed and are negative.  Physical Exam   Blood pressure 110/66, pulse 78, temperature 98 F (36.7 C), resp. rate 17, height _0  (1.473 m), weight 51.3 kg, last menstrual period 08/08/2019, SpO2 100 %.  Physical Exam  Nursing note and vitals reviewed. Constitutional: She is oriented to person, place, and time. She appears well-developed and well-nourished.  Cardiovascular: Normal rate.  Respiratory: Effort normal and breath sounds normal.  GI: Soft. She exhibits no distension. There is no abdominal tenderness. There is no rebound, no guarding and no CVA tenderness.  Genitourinary:    Genitourinary Comments: Small clot at introitus, dislodged with patient Valsalva and collected for pathology. Small bleeding visualized in vault. Removed  with fox swab x 1. No CMT.   Neurological: She is alert and oriented to person, place, and time.  Skin: Skin is warm and dry.  Psychiatric: She has a normal mood and affect. Her behavior is normal. Thought content normal.    MAU Course/MDM  Procedures  --Appropriate drop in quant. Day 0 (10/03) = 2,494, Day 4 (09/27/19) = 1,746 --Patient initially declined pelvic exam, blood draw and pain medicine in MAU. Discussed importance as components of workup, patient consent achieved.  Patient Vitals for the past 24 hrs:  BP Temp Temp src Pulse Resp SpO2 Height Weight  09/29/19  1951 110/66 - - 78 17 100 % - -  09/29/19 1935 - - - 100 - 100 % - -  09/29/19 1931 130/86 98 F (36.7 C) - - 20 - _0  (1.473 m) 51.3 kg  09/29/19 1856 114/79 98.2 F (36.8 C) Oral 100 18 100 % - -  09/29/19 1855 - - - - - 100 % - -  09/29/19 1844 114/79 98.2 F (36.8 C) Oral (!) 101 16 100 % - -   Orders Placed This Encounter  Procedures  . US OB Transvaginal  . hCG, quantitative, pregnancy   Report given to M. Jimmye Norman, CNM who assumes care of patient at this time.  Mallie Snooks, MSN, CNM Certified Nurse Midwife, [facility] 09/29/19 8:58 PM   Results for orders placed or performed during the hospital encounter of 09/29/19 (from the past 24 hour(s))  hCG, quantitative, pregnancy     Status: Abnormal   Collection Time: 09/29/19  8:59 PM  Result Value Ref Range   hCG, Beta Chain, Quant, S 1,550 (H) <5 mIU/mL    Ref. Range 09/27/2019 10:05  hCG Quant Latest Units: mIU/mL 1,746   US Ob Transvaginal  Result Date: 09/29/2019 CLINICAL DATA:  Left lower quadrant abdominal pain. Methotrexate given on 09/24/2019. Seven weeks and 3 days pregnant by last menstrual period. Decreasing quantitative beta HCG. EXAM: TRANSVAGINAL OB ULTRASOUND TECHNIQUE: Transvaginal ultrasound was performed for complete evaluation of the gestation as well as the maternal uterus, adnexal regions, and pelvic cul-de-sac. COMPARISON:  09/22/2019 FINDINGS: Intrauterine gestational sac: A tiny gestational sac is again demonstrated within the endometrium. Yolk sac:  Not visualized Embryo:  Not visualized MSD: 3.59 mm   5 w   0 d Subchorionic hemorrhage:  None visualized. Maternal uterus/adnexae: Normal appearing maternal ovaries with a right ovarian corpus luteum noted. Small amount of free peritoneal fluid without internal echoes. IMPRESSION: 1. A tiny residual intrauterine gestational sac is demonstrated with an interval mild decrease in size. 2. No visible fetal pole. This is compatible with an aborted  intrauterine gestation without expulsion of the previously demonstrated tiny gestational sac. 3. Small amount of free peritoneal fluid without evidence of hemorrhage. Electronically Signed   By: Claudie Revering M.D.   On: 09/29/2019 22:04   Reviewed the dropping HCG level [place] essentially unchanged with only a small amt of FF, no evid of hemorrhage States pain is very much decreased  Consulted Dr Kennon Rounds Recommend she move her Day 7 HCG to Saturday since it is so late  Assessment and Plan  Pregnancy at 37w4dPresumed ectopic pregnancy S/P Methotrexate Decreasing HCG levels  DIscharge home Strict ectopic precautions Followup Sat or as needed Encouraged to return here or to other Urgent Care/ED if she develops worsening of symptoms, increase in pain, fever, or other concerning symptoms.    WSeabron Spates CNM

## 2019-09-29 NOTE — ED Triage Notes (Addendum)
Pt complaining of LLQ pain that radiates to her back that started two hours ago. LMP 08/08/2019. Pt states she has been having vaginal bleeding after receiving a shot for her ectopic pregnancy 2 days ago.

## 2019-09-30 ENCOUNTER — Other Ambulatory Visit: Payer: BLUE CROSS/BLUE SHIELD

## 2019-10-01 ENCOUNTER — Inpatient Hospital Stay (HOSPITAL_COMMUNITY)
Admission: AD | Admit: 2019-10-01 | Discharge: 2019-10-01 | Disposition: A | Discharge status: Home / Self Care | Payer: BLUE CROSS/BLUE SHIELD | Attending: Obstetrics & Gynecology | Admitting: Obstetrics & Gynecology

## 2019-10-01 ENCOUNTER — Other Ambulatory Visit: Payer: Self-pay

## 2019-10-01 DIAGNOSIS — Z79899 Other long term (current) drug therapy: Secondary | ICD-10-CM

## 2019-10-01 DIAGNOSIS — IMO0002 Reserved for concepts with insufficient information to code with codable children: Secondary | ICD-10-CM

## 2019-10-01 DIAGNOSIS — O00109 Unspecified tubal pregnancy without intrauterine pregnancy: Secondary | ICD-10-CM | POA: Insufficient documentation

## 2019-10-01 DIAGNOSIS — O009 Unspecified ectopic pregnancy without intrauterine pregnancy: Secondary | ICD-10-CM

## 2019-10-01 DIAGNOSIS — Z5181 Encounter for therapeutic drug level monitoring: Secondary | ICD-10-CM

## 2019-10-01 DIAGNOSIS — R799 Abnormal finding of blood chemistry, unspecified: Secondary | ICD-10-CM

## 2019-10-01 LAB — HCG, QUANTITATIVE, PREGNANCY: hCG, Beta Chain, Quant, S: 312 m[IU]/mL — ABNORMAL HIGH (ref <5)

## 2019-10-01 NOTE — MAU Provider Note (Signed)
Subjective:  Victoria Montgomery is a 28 y.o. G1P0000 at [redacted]w[redacted]d who presents today for FU BHCG following 1 dose of MTX, today is day 7. She was seen on 10/1. Results from that day show gestational sac, no yolk sac and HCG 2481. She reports light vaginal bleeding. She denies abdominal or pelvic pain.  Some lower back pain that comes and goes. Some fatigue.   Objective:  Physical Exam  Nursing note and vitals reviewed. Constitutional: She is oriented to person, place, and time. She appears well-developed and well-nourished. No distress.  HENT:  Head: Normocephalic.  Cardiovascular: Normal rate.  Respiratory: Effort normal.  GI: Soft. There is no tenderness.  Neurological: She is alert and oriented to person, place, and time. Skin: Skin is warm and dry.  Psychiatric: She has a normal mood and affect.   Results for orders placed or performed during the hospital encounter of 10/01/19 (from the past 24 hour(s))  hCG, quantitative, pregnancy     Status: Abnormal   Collection Time: 10/01/19 11:23 AM  Result Value Ref Range   hCG, Beta Chain, Quant, S 312 (H) <5 mIU/mL    Assessment/Plan: Presumed ectopic S/P MTX day 7 Significant decline in Quant. F/u blood work in 1 week at [facility]. Message sent to schedule Return to MAU if symptoms worse    Krishiv Sandler, Artist Pais, NP 10/01/2019 2:17 PM

## 2019-10-01 NOTE — Discharge Instructions (Signed)
Ectopic Pregnancy  An ectopic pregnancy happens when a fertilized egg grows outside the womb (uterus). The fertilized egg cannot stay alive outside of the womb. This problem often happens in a fallopian tube. It is often caused by damage to the tube. If this problem is found early, you may be treated with medicine that stops the egg from growing. If your tube tears or bursts open (ruptures), you will bleed inside. Often, there is very bad pain in the lower belly. This is an emergency. You will need surgery. Get help right away. Follow these instructions at home: After being treated with medicine or surgery:  Rest and limit your activity for as long as told by your doctor.  Until your doctor says that it is safe: ? Do not lift anything that is heavier than 10 lb (4.5 kg) or the limit that your doctor tells you. ? Avoid exercise and any movement that takes a lot of effort.  To prevent problems when pooping (constipation): ? Eat a healthy diet. This includes:  Fruits.  Vegetables.  Whole grains. ? Drink 6-8 glasses of water a day. Contact a doctor if: Get help right away if:  You have sudden and very bad pain in your belly.  You have very bad pain in your shoulders or neck.  You have pain that gets worse and is not helped by medicine.  You have: ? A fever or chills. ? Vaginal bleeding. ? Redness or swelling at the site of a surgical cut (incision).  You feel sick to your stomach (nauseous) or you throw up (vomit).  You feel dizzy or weak.  You feel light-headed or you pass out (faint). Summary  An ectopic pregnancy happens when a fertilized egg grows outside the womb (uterus).  If this problem is found early, you may be treated with medicine that stops the egg from growing.  If your tube tears or bursts open (ruptures), you will need surgery. This is an emergency. Get help right away. This information is not intended to replace advice given to you by your health care  provider. Make sure you discuss any questions you have with your health care provider. Document Released: 03/06/2009 Document Revised: 11/20/2017 Document Reviewed: 01/01/2017 Elsevier Patient Education  2020 Elsevier Inc.  

## 2019-10-01 NOTE — MAU Note (Signed)
Victoria Montgomery is a 28 y.o. at [redacted]w[redacted]d here in MAU reporting: here for day 7 labs post MTX. Having some back pain and some bleeding. Changing her pad 2 times per day. States bleeding and pain is less today than it was at last visit.   Pain score: 6/10  Vitals:   10/01/19 1130  BP: 110/66  Pulse: 84  Resp: 16  Temp: 98.3 F (36.8 C)  SpO2: 100%      Lab orders placed from triage: hcg

## 2019-10-03 LAB — SURGICAL PATHOLOGY

## 2019-10-07 ENCOUNTER — Other Ambulatory Visit: Payer: BLUE CROSS/BLUE SHIELD

## 2019-10-10 ENCOUNTER — Encounter: Payer: Self-pay | Admitting: Advanced Practice Midwife

## 2019-10-10 ENCOUNTER — Other Ambulatory Visit: Payer: Self-pay

## 2019-10-10 ENCOUNTER — Ambulatory Visit (INDEPENDENT_AMBULATORY_CARE_PROVIDER_SITE_OTHER): Payer: BLUE CROSS/BLUE SHIELD | Admitting: Advanced Practice Midwife

## 2019-10-10 VITALS — BP 112/74 | HR 69 | Wt 115.0 lb

## 2019-10-10 DIAGNOSIS — O009 Unspecified ectopic pregnancy without intrauterine pregnancy: Secondary | ICD-10-CM

## 2019-10-10 DIAGNOSIS — O039 Complete or unspecified spontaneous abortion without complication: Secondary | ICD-10-CM | POA: Insufficient documentation

## 2019-10-10 NOTE — Progress Notes (Signed)
Patient presents for F/U after miscarriage visit today. Pt denies any pain or bleeding today.

## 2019-10-10 NOTE — Progress Notes (Signed)
  GYNECOLOGY PROGRESS NOTE  History:  28 y.o. G1P0000 presents to [facility] office today for problem gyn visit. She is following up after methotrexate was given for presumed ectopic pregnancy.  She initially presented on 09/22/19 with spotting and cramping with hcg of 2481 and followed up in 48 hours with findings of inappropriate rise in hcg to 2494 with no visible IUP on [place].  Methotrexate was given on 09/24/19 for presumed ectopic.  Hcg dropped to 1746 then to 1550 and again to 312 during the following 7 days.  She did have heavy bleeding and pain and blood clots were sent to pathology on 10/8.  She currently denies any pain or bleeding.    The following portions of the patient's history were reviewed and updated as appropriate: allergies, current medications, past family history, past medical history, past social history, past surgical history and problem list. Last pap smear on 07/13/19 was normal.  Review of Systems:  Pertinent items are noted in HPI.   Objective:  Physical Exam Blood pressure 112/74, pulse 69, weight 115 lb (52.2 kg), last menstrual period 08/08/2019. VS reviewed, nursing note reviewed,  Constitutional: well developed, well nourished, no distress HEENT: normocephalic CV: normal rate Pulm/chest wall: normal effort Breast Exam: deferred Abdomen: soft Neuro: alert and oriented x 3 Skin: warm, dry Psych: affect normal Pelvic exam: Cervix pink, visually closed, without lesion, scant white creamy discharge, vaginal walls and external genitalia normal Bimanual exam: Cervix 0/long/high, firm, anterior, neg CMT, uterus nontender, nonenlarged, adnexa without tenderness, enlargement, or mass  Assessment & Plan:  1. Ectopic pregnancy without intrauterine pregnancy, unspecified location --Pathology report on 09/29/19 finds chorionic villi in large clots passed in MAU on that date.  This is consistent with failed IUP versus ectopic pregnancy. --Pt had appropriate drop in hcg with MTX  therapy and is now symptom free --Questions answered for pt about this pregnancy and about planning future pregnancy.   --If hcg drops again, pt may resume PNV and conceive after 1-2 cycles as desired.  Discussed use of ovulation kits while trying to conceive.   --F/U in 3 months with MD to discuss cycles/fertility plan/refer as needed  - Beta hCG quant (ref lab)   Fatima Blank, CNM 9:40 AM

## 2019-10-11 LAB — BETA HCG QUANT (REF LAB): hCG Quant: 3 m[IU]/mL

## 2019-10-20 ENCOUNTER — Encounter: Payer: BLUE CROSS/BLUE SHIELD | Admitting: Obstetrics and Gynecology

## 2019-11-30 ENCOUNTER — Other Ambulatory Visit: Payer: Self-pay

## 2019-11-30 DIAGNOSIS — Z20822 Contact with and (suspected) exposure to covid-19: Secondary | ICD-10-CM

## 2019-12-01 LAB — NOVEL CORONAVIRUS, NAA: SARS-CoV-2, NAA: NOT DETECTED

## 2019-12-07 ENCOUNTER — Ambulatory Visit (INDEPENDENT_AMBULATORY_CARE_PROVIDER_SITE_OTHER): Payer: BLUE CROSS/BLUE SHIELD

## 2019-12-07 ENCOUNTER — Other Ambulatory Visit: Payer: Self-pay

## 2019-12-07 VITALS — BP 119/69 | HR 96 | Temp 99.2°F | Ht <= 58 in | Wt 115.3 lb

## 2019-12-07 DIAGNOSIS — Z3201 Encounter for pregnancy test, result positive: Secondary | ICD-10-CM | POA: Diagnosis not present

## 2019-12-07 LAB — POCT URINE PREGNANCY: Preg Test, Ur: POSITIVE — AB

## 2019-12-07 NOTE — Patient Instructions (Signed)
Miscarriage °A miscarriage is the loss of an unborn baby (fetus) before the 20th week of pregnancy. °Follow these instructions at home: °Medicines ° °· Take over-the-counter and prescription medicines only as told by your doctor. °· If you were prescribed antibiotic medicine, take it as told by your doctor. Do not stop taking the antibiotic even if you start to feel better. °· Do not take NSAIDs unless your doctor says that this is safe for you. NSAIDs include aspirin and ibuprofen. These medicines can cause bleeding. °Activity °· Rest as directed. Ask your doctor what activities are safe for you. °· Have someone help you at home during this time. °General instructions °· Write down how many pads you use each day and how soaked they are. °· Watch the amount of tissue or clumps of blood (blood clots) that you pass from your vagina. Save any large amounts of tissue for your doctor. °· Do not use tampons, douche, or have sex until your doctor approves. °· To help you and your partner with the process of grieving, talk with your doctor or seek counseling. °· When you are ready, meet with your doctor to talk about steps you should take for your health. Also, talk with your doctor about steps to take to have a healthy pregnancy in the future. °· Keep all follow-up visits as told by your doctor. This is important. °Contact a doctor if: °· You have a fever or chills. °· You have vaginal discharge that smells bad. °· You have more bleeding. °Get help right away if: °· You have very bad cramps or pain in your back or belly. °· You pass clumps of blood that are walnut-sized or larger from your vagina. °· You pass tissue that is walnut-sized or larger from your vagina. °· You soak more than 1 regular pad in an hour. °· You get light-headed or weak. °· You faint (pass out). °· You have feelings of sadness that do not go away, or you have thoughts of hurting yourself. °Summary °· A miscarriage is the loss of an unborn baby before  the 20th week of pregnancy. °· Follow your doctor's instructions for home care. Keep all follow-up appointments. °· To help you and your partner with the process of grieving, talk with your doctor or seek counseling. °This information is not intended to replace advice given to you by your health care provider. Make sure you discuss any questions you have with your health care provider. °Document Released: 03/01/2012 Document Revised: 04/01/2019 Document Reviewed: 01/13/2017 °Elsevier Patient Education © 2020 Elsevier Inc. ° °

## 2019-12-07 NOTE — Progress Notes (Signed)
I have reviewed this chart and agree with the RN/CMA assessment and management.   Patient here for pregnancy confirmation, positive UPT. Had ectopic in 08/2019 txtd with methotrexate, states she is having similar symptoms as some mild pain and light bleeding x 1 day. Will get HCG today, have patient return Friday for stat HCG. Precautions given to go to MAU if severe pain, heavy bleeding, etc.  Feliz Beam, M.D. Attending Center for [employer])

## 2019-12-07 NOTE — Progress Notes (Signed)
Victoria Montgomery presents today for UPT. She has no unusual complaints and complains of backache 7/10, cramps 4/10, leg pain 8/10 x 2 weeks.   LMP:10/31/2019 EDD 08/06/2020 [redacted]W[redacted]D    OBJECTIVE: Appears well, in no apparent distress.  OB History    Gravida  2   Para  0   Term  0   Preterm  0   AB  1   Living  0     SAB  0   TAB  0   Ectopic  1   Multiple  0   Live Births  0          Home UPT Result: POSITIVE X 2 In-Office UPT result: POSITIVE  I have reviewed the patient's medical, obstetrical, social, and family histories, and medications.   ASSESSMENT: Positive pregnancy test  PLAN Prenatal care to be completed at: [facility] Per Dr. Rosana Hoes patient did HCG Quant today and will return on Friday for STAT HCG and review with the Provider who is working on Friday.

## 2019-12-08 LAB — BETA HCG QUANT (REF LAB): hCG Quant: 1759 m[IU]/mL

## 2019-12-09 ENCOUNTER — Other Ambulatory Visit: Payer: Self-pay

## 2019-12-09 ENCOUNTER — Other Ambulatory Visit: Payer: BLUE CROSS/BLUE SHIELD

## 2019-12-09 ENCOUNTER — Encounter: Payer: Self-pay | Admitting: Obstetrics

## 2019-12-09 ENCOUNTER — Ambulatory Visit (INDEPENDENT_AMBULATORY_CARE_PROVIDER_SITE_OTHER): Payer: BLUE CROSS/BLUE SHIELD | Admitting: Obstetrics

## 2019-12-09 DIAGNOSIS — Z3A01 Less than 8 weeks gestation of pregnancy: Secondary | ICD-10-CM

## 2019-12-09 DIAGNOSIS — R109 Unspecified abdominal pain: Secondary | ICD-10-CM

## 2019-12-09 DIAGNOSIS — Z3490 Encounter for supervision of normal pregnancy, unspecified, unspecified trimester: Secondary | ICD-10-CM

## 2019-12-09 DIAGNOSIS — Z3201 Encounter for pregnancy test, result positive: Secondary | ICD-10-CM

## 2019-12-09 DIAGNOSIS — O26899 Other specified pregnancy related conditions, unspecified trimester: Secondary | ICD-10-CM

## 2019-12-09 NOTE — Progress Notes (Signed)
Patient ID: Victoria Montgomery, female   DOB: Jul 07, 1991, 28 y.o.   MRN: [id number]  No chief complaint on file.   HPI Victoria Montgomery is a 28 y.o. female.  Patient has a history of an ectopic pregnancy in October 2020.  She now pregnant again and is having some cramping, and is worried that she may have a repeat ectopic. HPI  History reviewed. No pertinent past medical history.  Past Surgical History:  Procedure Laterality Date  . NO PAST SURGERIES      Family History  Problem Relation Age of Onset  . Diabetes Mother     Social History Social History   Tobacco Use  . Smoking status: Never Smoker  . Smokeless tobacco: Never Used  Substance Use Topics  . Alcohol use: Never  . Drug use: Never    No Known Allergies  Current Outpatient Medications  Medication Sig Dispense Refill  . acetaminophen (TYLENOL) 500 MG tablet Take 500 mg by mouth every 6 (six) hours as needed.    . Blood Pressure Monitoring (BLOOD PRESSURE KIT) DEVI 1 kit by Does not apply route once a week. Check BP Weekly. Large cuff  EX O090.0 (Patient not taking: Reported on 12/07/2019) 1 kit 0  . terconazole (TERAZOL 7) 0.4 % vaginal cream Place 1 applicator vaginally at bedtime. 45 g 0   No current facility-administered medications for this visit.    Review of Systems Review of Systems Constitutional: negative for fatigue and weight loss Respiratory: negative for cough and wheezing Cardiovascular: negative for chest pain, fatigue and palpitations Gastrointestinal: positive for mild abdominal pain  Genitourinary:negative Integument/breast: negative for nipple discharge Musculoskeletal:negative for myalgias Neurological: negative for gait problems and tremors Behavioral/Psych: negative for abusive relationship, depression Endocrine: negative for temperature intolerance      Last menstrual period 10/31/2019.  Physical Exam Physical Exam:  Deferred  >50% of 15 min visit spent on counseling and  coordination of care.   Data Reviewed Quantitative beta Hcg   Assessment         Plan    Repeat Quantitative beta Hcg in 48 hours  No orders of the defined types were placed in this encounter.  No orders of the defined types were placed in this encounter.   Shelly Bombard, MD 12/09/2019 11:47 AM

## 2019-12-10 LAB — BETA HCG QUANT (REF LAB): hCG Quant: 4382 m[IU]/mL

## 2019-12-12 ENCOUNTER — Other Ambulatory Visit: Payer: BLUE CROSS/BLUE SHIELD

## 2019-12-12 ENCOUNTER — Other Ambulatory Visit: Payer: Self-pay

## 2019-12-12 DIAGNOSIS — Z3490 Encounter for supervision of normal pregnancy, unspecified, unspecified trimester: Secondary | ICD-10-CM

## 2019-12-13 ENCOUNTER — Telehealth (INDEPENDENT_AMBULATORY_CARE_PROVIDER_SITE_OTHER): Payer: BLUE CROSS/BLUE SHIELD | Admitting: Obstetrics and Gynecology

## 2019-12-13 ENCOUNTER — Encounter: Payer: Self-pay | Admitting: Obstetrics and Gynecology

## 2019-12-13 DIAGNOSIS — Z3490 Encounter for supervision of normal pregnancy, unspecified, unspecified trimester: Secondary | ICD-10-CM | POA: Diagnosis not present

## 2019-12-13 LAB — BETA HCG QUANT (REF LAB): hCG Quant: 13387 m[IU]/mL

## 2019-12-13 NOTE — Progress Notes (Signed)
Pt states she is having some cramping and lower back pain.  Pt states feels like normal period cramps.  Pt denies bleeding.

## 2019-12-13 NOTE — Progress Notes (Signed)
TELEHEALTH GYNECOLOGY VIRTUAL VIDEO VISIT ENCOUNTER NOTE  Provider location: Center for [employer] at [place]   I connected with Arvil Chaco on 12/13/19 at  8:30 AM EST by MyChart Video Encounter at home and verified that I am speaking with the correct person using two identifiers.   I discussed the limitations, risks, security and privacy concerns of performing an evaluation and management service virtually and the availability of in person appointments. I also discussed with the patient that there may be a patient responsible charge related to this service. The patient expressed understanding and agreed to proceed.   History:  Victoria Montgomery is a 28 y.o. G102P0010 female being evaluated today to discuss quant HCG results. Patient with previous ectopic pregnancy. She denies any abnormal vaginal discharge, bleeding, pelvic pain or other concerns.  She reports some occasional cramping pain and lower back pain     No past medical history on file. Past Surgical History:  Procedure Laterality Date  . NO PAST SURGERIES     The following portions of the patient's history were reviewed and updated as appropriate: allergies, current medications, past family history, past medical history, past social history, past surgical history and problem list.   Health Maintenance:  Normal pap and negative HRHPV on 06/2019.   Review of Systems:  Pertinent items noted in HPI and remainder of comprehensive ROS otherwise negative.  Physical Exam:   General:  Alert, oriented and cooperative. Patient appears to be in no acute distress.  Mental Status: Normal mood and affect. Normal behavior. Normal judgment and thought content.   Respiratory: Normal respiratory effort, no problems with respiration noted  Rest of physical exam deferred due to type of encounter  Labs and Imaging Results for orders placed or performed in visit on 12/12/19 (from the past 336 hour(s))  Beta hCG quant (ref lab)   Collection Time: 12/12/19 11:08 AM  Result Value Ref Range   hCG Quant 13,387 mIU/mL  Results for orders placed or performed in visit on 12/09/19 (from the past 336 hour(s))  Beta hCG quant (ref lab)   Collection Time: 12/09/19 11:49 AM  Result Value Ref Range   hCG Quant 4,382 mIU/mL  Results for orders placed or performed in visit on 12/07/19 (from the past 336 hour(s))  hCG, quantitative, pregnancy   Collection Time: 12/07/19 11:22 AM  Result Value Ref Range   hCG Quant 1,759 mIU/mL  POCT urine pregnancy   Collection Time: 12/07/19 11:31 AM  Result Value Ref Range   Preg Test, Ur Positive (A) Negative  Results for orders placed or performed in visit on 11/30/19 (from the past 336 hour(s))  Novel Coronavirus, NAA ([facility])   Collection Time: 11/30/19  3:30 PM   Specimen: Nasopharyngeal(NP) swabs in vial transport medium   NASOPHARYNGE  TESTING  Result Value Ref Range   SARS-CoV-2, NAA Not Detected Not Detected   No results found.     Assessment and Plan:     1. Early stage of pregnancy Discussed appropriate rising HCG Low probability of an ectopic pregnancy which still needs to be ruled out Viability ultrasound has been ordered Continue taking prenatal vitamins - US OB Transvaginal/Viability scan; Future       I discussed the assessment and treatment plan with the patient. The patient was provided an opportunity to ask questions and all were answered. The patient agreed with the plan and demonstrated an understanding of the instructions.   The patient was advised to call back or seek  an in-person evaluation/go to the ED if the symptoms worsen or if the condition fails to improve as anticipated.  I provided 12 minutes of face-to-face time during this encounter.   Catalina Antigua, MD Center for [employer], [facility]

## 2019-12-14 ENCOUNTER — Inpatient Hospital Stay (HOSPITAL_COMMUNITY)
Admission: AD | Admit: 2019-12-14 | Discharge: 2019-12-14 | Disposition: A | Discharge status: Home / Self Care | Payer: BLUE CROSS/BLUE SHIELD | Attending: Obstetrics and Gynecology | Admitting: Obstetrics and Gynecology

## 2019-12-14 ENCOUNTER — Other Ambulatory Visit: Payer: Self-pay

## 2019-12-14 ENCOUNTER — Encounter (HOSPITAL_COMMUNITY): Payer: Self-pay | Admitting: Obstetrics and Gynecology

## 2019-12-14 ENCOUNTER — Inpatient Hospital Stay (HOSPITAL_COMMUNITY): Payer: BLUE CROSS/BLUE SHIELD

## 2019-12-14 DIAGNOSIS — R109 Unspecified abdominal pain: Secondary | ICD-10-CM | POA: Diagnosis not present

## 2019-12-14 DIAGNOSIS — Z833 Family history of diabetes mellitus: Secondary | ICD-10-CM | POA: Diagnosis not present

## 2019-12-14 DIAGNOSIS — Z8759 Personal history of other complications of pregnancy, childbirth and the puerperium: Secondary | ICD-10-CM | POA: Insufficient documentation

## 2019-12-14 DIAGNOSIS — O99891 Other specified diseases and conditions complicating pregnancy: Secondary | ICD-10-CM | POA: Diagnosis present

## 2019-12-14 DIAGNOSIS — Z3491 Encounter for supervision of normal pregnancy, unspecified, first trimester: Secondary | ICD-10-CM

## 2019-12-14 DIAGNOSIS — Z3A01 Less than 8 weeks gestation of pregnancy: Secondary | ICD-10-CM | POA: Diagnosis not present

## 2019-12-14 DIAGNOSIS — Z79899 Other long term (current) drug therapy: Secondary | ICD-10-CM | POA: Diagnosis not present

## 2019-12-14 DIAGNOSIS — M549 Dorsalgia, unspecified: Secondary | ICD-10-CM | POA: Diagnosis not present

## 2019-12-14 LAB — URINALYSIS, ROUTINE W REFLEX MICROSCOPIC
Bilirubin Urine: NEGATIVE
Glucose, UA: NEGATIVE mg/dL
Hgb urine dipstick: NEGATIVE
Ketones, ur: NEGATIVE mg/dL
Leukocytes,Ua: NEGATIVE
Nitrite: NEGATIVE
Protein, ur: NEGATIVE mg/dL
Specific Gravity, Urine: 1.012 (ref 1.005–1.030)
pH: 7 (ref 5.0–8.0)

## 2019-12-14 NOTE — Discharge Instructions (Signed)
Back Injury Prevention Back injuries can be very painful. They can also be difficult to heal. After having one back injury, you are more likely to have another one again. It is important to learn how to avoid injuring or re-injuring your back. The following tips can help you to prevent a back injury. What actions can I take to prevent back injuries? Nutrition changes Talk with your health care provider about your overall diet, and especially about foods that strengthen your bones.  Ask your health care provider how much calcium and vitamin D you need each day. These nutrients help to prevent weakening of the bones (osteoporosis). Osteoporosis can cause broken (fractured) bones, which lead to back pain.  Eat foods that are good sources of calcium. These include dairy products, green leafy vegetables, and products that have had calcium added to them (fortified).  Eat foods that are good sources of vitamin D. These include milk and foods that are fortified with vitamin D.  If needed, take supplements and vitamins as directed by your health care provider. Physical fitness Physical fitness strengthens your bones and your muscles. It also increases your balance and strength.  Exercise for 30 minutes per day on most days of the week, or as directed by your health care provider. Make sure to: ? Do aerobic exercises, such as walking, jogging, biking, or swimming. ? Do exercises that increase balance and strength, such as tai chi and yoga. These can decrease your risk of falling and injuring your back. ? Do stretching exercises to help with flexibility. ? Develop strong abdominal muscles. Your abdominal muscles provide a lot of the support that your back needs.  Maintain a healthy weight. This helps to decrease your risk of a back injury. Good posture        Prevent back injuries by developing and maintaining a good posture. To do this successfully:  Sit up and stand up straight. Avoid leaning  forward when you sit or hunching over when you stand.  Choose chairs that have good low-back (lumbar) support.  If you work at a desk, sit close to it so you do not need to lean over. Keep your chin tucked in. Keep your neck drawn back, and keep your elbows bent at a right angle.  Sit high and close to the steering wheel when you drive. Add a lumbar support to your car seat, if needed.  Avoid sitting or standing in one position for very long. Take breaks to get up, stretch, and walk around at least one time every hour. Take breaks every hour if you are driving for long periods of time.  Sleep on your side with your knees slightly bent, or sleep on your back with a pillow under your knees.  Lifting, twisting, and reaching Back injuries are more likely to occur when carrying loads and twisting at the same time. When you bend and lift, or reach for items that are high up in shelves, use positions that put less stress on your back.  Heavy lifting ? Avoid heavy lifting, especially the kind of heavy lifting that is repetitive. If you must do heavy lifting: ? Stretch before lifting. ? Work slowly. ? Rest between lifts. ? Use a tool such as a cart or a dolly to move objects. ? Make several small trips instead of carrying one heavy load. ? Ask for help when you need it, especially when moving big or heavy objects. ? Follow these steps when lifting: ? Stand with your feet  shoulder-width apart. ? Get as close to the object as you can. Do not try to pick up a heavy object that is far from your body. ? Use handles or lifting straps if they are available. ? Bend at your knees. Squat down, but keep your heels off the floor. ? Keep your shoulders pulled back, your chin tucked in, and your back straight. ? Lift the object slowly while you tighten the muscles in your legs, abdomen, and buttocks. Keep the object as close to the center of your body as possible. ? Follow these steps when putting down a  heavy load: ? Stand with your feet shoulder-width apart. ? Lower the object slowly while you tighten the muscles in your legs, abdomen, and buttocks. Keep the object as close to the center of your body as possible. ? Keep your shoulders pulled back, your chin tucked in, and your back straight. ? Bend at your knees. Squat down, but keep your heels off the floor. ? Use handles or lifting straps if they are available.  Twisting and reaching ? Avoid lifting heavy objects above your waist. ? Do not twist at your waist while you are lifting or carrying a load. If you need to turn, move your feet. ? Do not bend over without bending at your knees. ? Avoid reaching over your head, across a table, or for an object on a high surface.  Other changes   Avoid wet floors and icy ground. Keep sidewalks clear of ice to prevent falls.  Do not sleep on a mattress that is too soft or too hard.  Put heavier objects on shelves at waist level, and put lighter objects on lower or higher shelves.  Find ways to decrease your stress, such as by exercising, getting a massage, or practicing relaxation techniques. Stress can build up in your muscles. Tense muscles are more vulnerable to injury.  Talk with your health care provider if you feel anxious or depressed. These conditions can make back pain worse.  Wear flat heel shoes with cushioned soles.  Use both shoulder straps when carrying a backpack.  Do not use any products that contain nicotine or tobacco, such as cigarettes and e-cigarettes. If you need help quitting, ask your health care provider. Summary  Back injuries can be very painful and difficult to heal.  You can prevent injuring or re-injuring your back by making nutrition changes, working on being physically fit, developing a good posture, and lifting heavy objects in a safe way.  Making other changes can also help to prevent back injuries. These include eating a healthy diet, exercising  regularly and maintaining a healthy weight. This information is not intended to replace advice given to you by your health care provider. Make sure you discuss any questions you have with your health care provider. Document Released: 01/15/2005 Document Revised: 01/23/2018 Document Reviewed: 01/23/2018 Elsevier Patient Education  2020 [place].   Acute Back Pain, Adult Acute back pain is sudden and usually short-lived. It is often caused by an injury to the muscles and tissues in the back. The injury may result from:  A muscle or ligament getting overstretched or torn (strained). Ligaments are tissues that connect bones to each other. Lifting something improperly can cause a back strain.  Wear and tear (degeneration) of the spinal disks. Spinal disks are circular tissue that provides cushioning between the bones of the spine (vertebrae).  Twisting motions, such as while playing sports or doing yard work.  A hit to  the back.  Arthritis. You may have a physical exam, lab tests, and imaging tests to find the cause of your pain. Acute back pain usually goes away with rest and home care. Follow these instructions at home: Managing pain, stiffness, and swelling  Take over-the-counter and prescription medicines only as told by your health care provider.  Your health care provider may recommend applying ice during the first 24-48 hours after your pain starts. To do this: ? Put ice in a plastic bag. ? Place a towel between your skin and the bag. ? Leave the ice on for 20 minutes, 2-3 times a day.  If directed, apply heat to the affected area as often as told by your health care provider. Use the heat source that your health care provider recommends, such as a moist heat pack or a heating pad. ? Place a towel between your skin and the heat source. ? Leave the heat on for 20-30 minutes. ? Remove the heat if your skin turns bright red. This is especially important if you are unable to feel  pain, heat, or cold. You have a greater risk of getting burned. Activity   Do not stay in bed. Staying in bed for more than 1-2 days can delay your recovery.  Sit up and stand up straight. Avoid leaning forward when you sit, or hunching over when you stand. ? If you work at a desk, sit close to it so you do not need to lean over. Keep your chin tucked in. Keep your neck drawn back, and keep your elbows bent at a right angle. Your arms should look like the letter "L." ? Sit high and close to the steering wheel when you drive. Add lower back (lumbar) support to your car seat, if needed.  Take short walks on even surfaces as soon as you are able. Try to increase the length of time you walk each day.  Do not sit, drive, or stand in one place for more than 30 minutes at a time. Sitting or standing for long periods of time can put stress on your back.  Do not drive or use heavy machinery while taking prescription pain medicine.  Use proper lifting techniques. When you bend and lift, use positions that put less stress on your back: ? Hope your knees. ? Keep the load close to your body. ? Avoid twisting.  Exercise regularly as told by your health care provider. Exercising helps your back heal faster and helps prevent back injuries by keeping muscles strong and flexible.  Work with a physical therapist to make a safe exercise program, as recommended by your health care provider. Do any exercises as told by your physical therapist. Lifestyle  Maintain a healthy weight. Extra weight puts stress on your back and makes it difficult to have good posture.  Avoid activities or situations that make you feel anxious or stressed. Stress and anxiety increase muscle tension and can make back pain worse. Learn ways to manage anxiety and stress, such as through exercise. General instructions  Sleep on a firm mattress in a comfortable position. Try lying on your side with your knees slightly bent. If you lie  on your back, put a pillow under your knees.  Follow your treatment plan as told by your health care provider. This may include: ? Cognitive or behavioral therapy. ? Acupuncture or massage therapy. ? Meditation or yoga. Contact a health care provider if:  You have pain that is not relieved with rest or  medicine.  You have increasing pain going down into your legs or buttocks.  Your pain does not improve after 2 weeks.  You have pain at night.  You lose weight without trying.  You have a fever or chills. Get help right away if:  You develop new bowel or bladder control problems.  You have unusual weakness or numbness in your arms or legs.  You develop nausea or vomiting.  You develop abdominal pain.  You feel faint. Summary  Acute back pain is sudden and usually short-lived.  Use proper lifting techniques. When you bend and lift, use positions that put less stress on your back.  Take over-the-counter and prescription medicines and apply heat or ice as directed by your health care provider. This information is not intended to replace advice given to you by your health care provider. Make sure you discuss any questions you have with your health care provider. Document Released: 12/08/2005 Document Revised: 03/29/2019 Document Reviewed: 07/22/2017 Elsevier Patient Education  2020 [employer].

## 2019-12-14 NOTE — MAU Note (Signed)
Having too much back ache for 48hrs, also have some cramping in lower abd.  Since pregnant has been having continuous pain in low back, it never stops.
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
## 2019-12-21 ENCOUNTER — Ambulatory Visit (HOSPITAL_COMMUNITY): Payer: BLUE CROSS/BLUE SHIELD

## 2019-12-26 ENCOUNTER — Inpatient Hospital Stay (HOSPITAL_COMMUNITY): Payer: BLUE CROSS/BLUE SHIELD

## 2019-12-26 ENCOUNTER — Other Ambulatory Visit: Payer: Self-pay

## 2019-12-26 ENCOUNTER — Inpatient Hospital Stay (HOSPITAL_COMMUNITY)
Admission: AD | Admit: 2019-12-26 | Discharge: 2019-12-26 | Disposition: A | Discharge status: Home / Self Care | Payer: BLUE CROSS/BLUE SHIELD | Attending: Family Medicine | Admitting: Family Medicine

## 2019-12-26 ENCOUNTER — Encounter (HOSPITAL_COMMUNITY): Payer: Self-pay | Admitting: Family Medicine

## 2019-12-26 DIAGNOSIS — Z6790 Unspecified blood type, Rh positive: Secondary | ICD-10-CM

## 2019-12-26 DIAGNOSIS — O208 Other hemorrhage in early pregnancy: Secondary | ICD-10-CM | POA: Diagnosis present

## 2019-12-26 DIAGNOSIS — O468X1 Other antepartum hemorrhage, first trimester: Secondary | ICD-10-CM

## 2019-12-26 DIAGNOSIS — O418X10 Other specified disorders of amniotic fluid and membranes, first trimester, not applicable or unspecified: Secondary | ICD-10-CM

## 2019-12-26 DIAGNOSIS — Z3A08 8 weeks gestation of pregnancy: Secondary | ICD-10-CM

## 2019-12-26 DIAGNOSIS — O4690 Antepartum hemorrhage, unspecified, unspecified trimester: Secondary | ICD-10-CM

## 2019-12-26 LAB — URINALYSIS, ROUTINE W REFLEX MICROSCOPIC
Bilirubin Urine: NEGATIVE
Glucose, UA: NEGATIVE mg/dL
Hgb urine dipstick: NEGATIVE
Ketones, ur: NEGATIVE mg/dL
Leukocytes,Ua: NEGATIVE
Nitrite: NEGATIVE
Protein, ur: NEGATIVE mg/dL
Specific Gravity, Urine: 1.01 (ref 1.005–1.030)
pH: 7 (ref 5.0–8.0)

## 2019-12-26 LAB — CBC
HCT: 37.3 % (ref 36.0–46.0)
Hemoglobin: 12.2 g/dL (ref 12.0–15.0)
MCH: 26.5 pg (ref 26.0–34.0)
MCHC: 32.7 g/dL (ref 30.0–36.0)
MCV: 80.9 fL (ref 80.0–100.0)
Platelets: 327 10*3/uL (ref 150–400)
RBC: 4.61 MIL/uL (ref 3.87–5.11)
RDW: 13.3 % (ref 11.5–15.5)
WBC: 6.4 10*3/uL (ref 4.0–10.5)
nRBC: 0 % (ref 0.0–0.2)

## 2019-12-26 LAB — WET PREP, GENITAL
Clue Cells Wet Prep HPF POC: NONE SEEN
Sperm: NONE SEEN
Trich, Wet Prep: NONE SEEN
Yeast Wet Prep HPF POC: NONE SEEN

## 2019-12-26 NOTE — MAU Note (Signed)
.   Victoria Montgomery is a 29 y.o. at [redacted]w[redacted]d here in MAU reporting: lower abdominal cramping and lower back pain since yesterday. PT states she had some spotting since last night but denies at present LMP: 10/31/19 Onset of complaint: yesterday Pain score: abdomen 5/ back 6 Vitals:   12/26/19 0930 12/26/19 0933  BP:  (!) 113/54  Pulse: 96   Resp: 20 18  Temp: 98.6 F (37 C)   SpO2:  100%     FHT: Lab orders placed from triage: UA

## 2019-12-26 NOTE — Discharge Instructions (Signed)
Subchorionic Hematoma ° °A subchorionic hematoma is a gathering of blood between the outer wall of the embryo (chorion) and the inner wall of the womb (uterus). °This condition can cause vaginal bleeding. If they cause little or no vaginal bleeding, early small hematomas usually shrink on their own and do not affect your baby or pregnancy. When bleeding starts later in pregnancy, or if the hematoma is larger or occurs in older pregnant women, the condition may be more serious. Larger hematomas may get bigger, which increases the chances of miscarriage. This condition also increases the risk of: °· Premature separation of the placenta from the uterus. °· Premature (preterm) labor. °· Stillbirth. °What are the causes? °The exact cause of this condition is not known. It occurs when blood is trapped between the placenta and the uterine wall because the placenta has separated from the original site of implantation. °What increases the risk? °You are more likely to develop this condition if: °· You were treated with fertility medicines. °· You conceived through in vitro fertilization (IVF). °What are the signs or symptoms? °Symptoms of this condition include: °· Vaginal spotting or bleeding. °· Contractions of the uterus. These cause abdominal pain. °Sometimes you may have no symptoms and the bleeding may only be seen when ultrasound images are taken (transvaginal ultrasound). °How is this diagnosed? °This condition is diagnosed based on a physical exam. This includes a pelvic exam. You may also have other tests, including: °· Blood tests. °· Urine tests. °· Ultrasound of the abdomen. °How is this treated? °Treatment for this condition can vary. Treatment may include: °· Watchful waiting. You will be monitored closely for any changes in bleeding. During this stage: °? The hematoma may be reabsorbed by the body. °? The hematoma may separate the fluid-filled space containing the embryo (gestational sac) from the wall of the  womb (endometrium). °· Medicines. °· Activity restriction. This may be needed until the bleeding stops. °Follow these instructions at home: °· Stay on bed rest if told to do so by your health care provider. °· Do not lift anything that is heavier than 10 lbs. (4.5 kg) or as told by your health care provider. °· Do not use any products that contain nicotine or tobacco, such as cigarettes and e-cigarettes. If you need help quitting, ask your health care provider. °· Track and write down the number of pads you use each day and how soaked (saturated) they are. °· Do not use tampons. °· Keep all follow-up visits as told by your health care provider. This is important. Your health care provider may ask you to have follow-up blood tests or ultrasound tests or both. °Contact a health care provider if: °· You have any vaginal bleeding. °· You have a fever. °Get help right away if: °· You have severe cramps in your stomach, back, abdomen, or pelvis. °· You pass large clots or tissue. Save any tissue for your health care provider to look at. °· You have more vaginal bleeding, and you faint or become lightheaded or weak. °Summary °· A subchorionic hematoma is a gathering of blood between the outer wall of the placenta and the uterus. °· This condition can cause vaginal bleeding. °· Sometimes you may have no symptoms and the bleeding may only be seen when ultrasound images are taken. °· Treatment may include watchful waiting, medicines, or activity restriction. °This information is not intended to replace advice given to you by your health care provider. Make sure you discuss any questions you   have with your health care provider. Document Revised: 11/20/2017 Document Reviewed: 02/03/2017 Elsevier Patient Education  2020 Else[employer] Inc.        Vaginal Bleeding During Pregnancy, First Trimester  A small amount of bleeding from the vagina (spotting) is relatively common during early pregnancy. It usually stops on its  own. Various things may cause bleeding or spotting during early pregnancy. Some bleeding may be related to the pregnancy, and some may not. In many cases, the bleeding is normal and is not a problem. However, bleeding can also be a sign of something serious. Be sure to tell your health care provider about any vaginal bleeding right away. Some possible causes of vaginal bleeding during the first trimester include:  Infection or inflammation of the cervix.  Growths (polyps) on the cervix.  Miscarriage or threatened miscarriage.  Pregnancy tissue developing outside of the uterus (ectopic pregnancy).  A mass of tissue developing in the uterus due to an egg being fertilized incorrectly (molar pregnancy). Follow these instructions at home: Activity  Follow instructions from your health care provider about limiting your activity. Ask what activities are safe for you.  If needed, make plans for someone to help with your regular activities.  Do not have sex or orgasms until your health care provider says that this is safe. General instructions  Take over-the-counter and prescription medicines only as told by your health care provider.  Pay attention to any changes in your symptoms.  Do not use tampons or douche.  Write down how many pads you use each day, how often you change pads, and how soaked (saturated) they are.  If you pass any tissue from your vagina, save the tissue so you can show it to your health care provider.  Keep all follow-up visits as told by your health care provider. This is important. Contact a health care provider if:  You have vaginal bleeding during any part of your pregnancy.  You have cramps or labor pains.  You have a fever. Get help right away if:  You have severe cramps in your back or abdomen.  You pass large clots or a large amount of tissue from your vagina.  Your bleeding increases.  You feel light-headed or weak, or you faint.  You have  chills.  You are leaking fluid or have a gush of fluid from your vagina. Summary  A small amount of bleeding (spotting) from the vagina is relatively common during early pregnancy.  Various things may cause bleeding or spotting in early pregnancy.  Be sure to tell your health care provider about any vaginal bleeding right away. This information is not intended to replace advice given to you by your health care provider. Make sure you discuss any questions you have with your health care provider. Document Revised: 03/29/2019 Document Reviewed: 03/12/2017 Elsevier Patient Education  2020 Elsevier Inc.  

## 2019-12-26 NOTE — MAU Provider Note (Signed)
History     CSN: [id number]  Arrival date and time: 12/26/19 1443   First Provider Initiated Contact with Patient 12/26/19 1006      Chief Complaint  Patient presents with  . Vaginal Bleeding   Ms. Victoria Montgomery is a 29 y.o. G2P0010 at [redacted]w[redacted]d who presents to MAU for vaginal bleeding which began yesterday night. Pt reports seeing blood when wiping. Pt denies any bleeding today.  Passing blood clots? no Blood soaking clothes? no Lightheaded/dizzy? no Significant pelvic pain or cramping? mild cramping Passed any tissue? no Hx ectopic pregnancy? Was treated for one, but pathology showed chorionic villi  Current pregnancy problems? pt was seen in MAU around 6 weeks of pregnancy for cramping (showed single IUP, [redacted]w[redacted]d, FHR 105) Blood Type? B Positive Allergies? NKDA Current medications? PNVs Current PNC & next appt? [facility], 01/17/2020  Pt denies vaginal discharge/odor/itching. Pt denies N/V, abdominal pain, constipation, diarrhea, or urinary problems. Pt denies fever, chills, fatigue, sweating or changes in appetite. Pt denies SOB or chest pain. Pt denies dizziness, HA, light-headedness, weakness.   OB History    Gravida  2   Para  0   Term  0   Preterm  0   AB  1   Living  0     SAB  0   TAB  0   Ectopic  1   Multiple  0   Live Births  0           History reviewed. No pertinent past medical history.  Past Surgical History:  Procedure Laterality Date  . NO PAST SURGERIES      Family History  Problem Relation Age of Onset  . Diabetes Mother     Social History   Tobacco Use  . Smoking status: Never Smoker  . Smokeless tobacco: Never Used  Substance Use Topics  . Alcohol use: Never  . Drug use: Never    Allergies: No Known Allergies  Medications Prior to Admission  Medication Sig Dispense Refill Last Dose  . acetaminophen (TYLENOL) 500 MG tablet Take 500 mg by mouth every 6 (six) hours as needed.   12/26/2019 at Unknown time  . Prenatal  Vit-Fe Fumarate-FA (MULTIVITAMIN-PRENATAL) 27-0.8 MG TABS tablet Take 1 tablet by mouth daily at 12 noon.   12/26/2019 at Unknown time    Review of Systems  Constitutional: Negative for chills, diaphoresis, fatigue and fever.  Eyes: Negative for visual disturbance.  Respiratory: Negative for shortness of breath.   Cardiovascular: Negative for chest pain.  Gastrointestinal: Negative for abdominal pain, constipation, diarrhea, nausea and vomiting.  Genitourinary: Positive for pelvic pain (mild cramping) and vaginal bleeding (not present during MAU visit). Negative for dysuria, flank pain, frequency, urgency and vaginal discharge.  Neurological: Negative for dizziness, weakness, light-headedness and headaches.   Physical Exam   Blood pressure (!) 113/54, pulse 96, temperature 98.6 F (37 C), resp. rate 18, weight 53.1 kg, last menstrual period 10/31/2019, SpO2 100 %.  Patient Vitals for the past 24 hrs:  BP Temp Pulse Resp SpO2 Weight  12/26/19 0933 (!) 113/54 -- -- 18 100 % 53.1 kg  12/26/19 0930 -- 98.6 F (37 C) 96 20 -- --   Physical Exam  Constitutional: She is oriented to person, place, and time. She appears well-developed and well-nourished. No distress.  HENT:  Head: Normocephalic and atraumatic.  Respiratory: Effort normal.  GI: Soft. She exhibits no distension and no mass. There is no abdominal tenderness. There is no rebound and no  guarding.  Neurological: She is alert and oriented to person, place, and time.  Skin: Skin is warm and dry. She is not diaphoretic.  Psychiatric: She has a normal mood and affect. Her behavior is normal. Judgment and thought content normal.   Results for orders placed or performed during the hospital encounter of 12/26/19 (from the past 24 hour(s))  Urinalysis, Routine w reflex microscopic     Status: None   Collection Time: 12/26/19  9:51 AM  Result Value Ref Range   Color, Urine YELLOW YELLOW   APPearance CLEAR CLEAR   Specific Gravity, Urine  1.010 1.005 - 1.030   pH 7.0 5.0 - 8.0   Glucose, UA NEGATIVE NEGATIVE mg/dL   Hgb urine dipstick NEGATIVE NEGATIVE   Bilirubin Urine NEGATIVE NEGATIVE   Ketones, ur NEGATIVE NEGATIVE mg/dL   Protein, ur NEGATIVE NEGATIVE mg/dL   Nitrite NEGATIVE NEGATIVE   Leukocytes,Ua NEGATIVE NEGATIVE  Wet prep, genital     Status: Abnormal   Collection Time: 12/26/19 10:15 AM  Result Value Ref Range   Yeast Wet Prep HPF POC NONE SEEN NONE SEEN   Trich, Wet Prep NONE SEEN NONE SEEN   Clue Cells Wet Prep HPF POC NONE SEEN NONE SEEN   WBC, Wet Prep HPF POC MODERATE (A) NONE SEEN   Sperm NONE SEEN   CBC     Status: None   Collection Time: 12/26/19 10:27 AM  Result Value Ref Range   WBC 6.4 4.0 - 10.5 K/uL   RBC 4.61 3.87 - 5.11 MIL/uL   Hemoglobin 12.2 12.0 - 15.0 g/dL   HCT 63.0 16.0 - 10.9 %   MCV 80.9 80.0 - 100.0 fL   MCH 26.5 26.0 - 34.0 pg   MCHC 32.7 30.0 - 36.0 g/dL   RDW 32.3 55.7 - 32.2 %   Platelets 327 150 - 400 K/uL   nRBC 0.0 0.0 - 0.2 %    US OB LESS THAN 14 WEEKS WITH OB TRANSVAGINAL  Result Date: 12/26/2019 CLINICAL DATA:  Pelvic pain and cramping EXAM: OBSTETRIC <14 WK [place] AND TRANSVAGINAL OB US TECHNIQUE: Both transabdominal and transvaginal ultrasound examinations were performed for complete evaluation of the gestation as well as the maternal uterus, adnexal regions, and pelvic cul-de-sac. Transvaginal technique was performed to assess early pregnancy. COMPARISON:  December 14, 2019 FINDINGS: Intrauterine gestational sac: Visualized Yolk sac:  Visualized Embryo:  Visualized Cardiac Activity: Visualized Heart Rate: 160 bpm CRL:  12 mm   7 w   3 d                  [place] EDC: August 10, 2020 Subchorionic hemorrhage: There is a subchorionic hemorrhage measuring 5 x 3 mm. Maternal uterus/adnexae: Cervical os is closed. Right ovary measures 3.7 x 2.7 x 2.4 cm. Left ovary measures 1.6 x 2.4 x 1.8 cm. There is no extrauterine pelvic or adnexal mass. No free pelvic fluid. IMPRESSION: Single  live intrauterine gestation with estimated gestational age of 7+ weeks. Subcentimeter subchorionic hemorrhage evident. Study otherwise unremarkable. Electronically Signed   By: Bretta Bang III M.D.   On: 12/26/2019 11:57   US OB LESS THAN 14 WEEKS WITH OB TRANSVAGINAL  Result Date: 12/14/2019 CLINICAL DATA:  Back pain in pregnancy. History of ectopic pregnancy. First trimester pregnancy. EXAM: OBSTETRIC <14 WK [place] AND TRANSVAGINAL OB US TECHNIQUE: Both transabdominal and transvaginal ultrasound examinations were performed for complete evaluation of the gestation as well as the maternal uterus, adnexal regions, and pelvic cul-de-sac.  Transvaginal technique was performed to assess early pregnancy. COMPARISON:  None for this pregnancy. FINDINGS: Intrauterine gestational sac: Present Yolk sac:  Present Embryo:  Present Cardiac Activity: Present Heart Rate: 104 bpm CRL: 2.49 mm   5 w   5 d                  [place] EDC: 08/10/2020 Subchorionic hemorrhage:  None visualized. Maternal uterus/adnexae: Unremarkable.  No free fluid is present. IMPRESSION: 1. Single intrauterine pregnancy. 2. Embryo identified with measurable heart rate of 104 beats per minute. Electronically Signed   By: Marin Roberts M.D.   On: 12/14/2019 16:12   MAU Course  Procedures  MDM -VB last night without bleeding today in setting of known IUP -UA: WNL -CBC: WNL -[place]: single IUP, FHR 160, sm subchorionic hemorrhage, cervical os closed, no extrauterine mass, no free fluid -ABO: B Positive -WetPrep: WNL (blind swab by RN) -GC/CT collected (blind swab by RN) -pt discharged to home in stable condition  Orders Placed This Encounter  Procedures  . Wet prep, genital    Standing Status:   Standing    Number of Occurrences:   1  . US OB LESS THAN 14 WEEKS WITH OB TRANSVAGINAL    Standing Status:   Standing    Number of Occurrences:   1    Order Specific Question:   Symptom/Reason for Exam    Answer:   Vaginal bleeding in  pregnancy [[id number]]  . Urinalysis, Routine w reflex microscopic    Standing Status:   Standing    Number of Occurrences:   1  . CBC    Standing Status:   Standing    Number of Occurrences:   1  . Discharge patient    Order Specific Question:   Discharge disposition    Answer:   01-Home or Self Care [1]    Order Specific Question:   Discharge patient date    Answer:   12/26/2019   Assessment and Plan   1. Subchorionic hemorrhage of placenta in first trimester, single or unspecified fetus   2. Vaginal bleeding in pregnancy   3. Blood type, Rh positive    Allergies as of 12/26/2019   No Known Allergies     Medication List    TAKE these medications   acetaminophen 500 MG tablet Commonly known as: TYLENOL Take 500 mg by mouth every 6 (six) hours as needed.   multivitamin-prenatal 27-0.8 MG Tabs tablet Take 1 tablet by mouth daily at 12 noon.      -will call with culture results, if positive -extensive reassurance given about normalcy of minimal bleeding/cramping with subchorionic hemorrhage and that this pregnancy is not an ectopic -discussed no tx for [facility] -strict bleeding/pain/return MAU precautions given -pt discharged to home in stable condition  Joni Reining E Josee Speece 12/26/2019, 12:20 PM

## 2019-12-27 LAB — GC/CHLAMYDIA PROBE AMP (~~LOC~~) NOT AT ARMC
Chlamydia: NEGATIVE
Comment: NEGATIVE
Comment: NORMAL
Neisseria Gonorrhea: NEGATIVE

## 2019-12-30 ENCOUNTER — Ambulatory Visit: Payer: BLUE CROSS/BLUE SHIELD | Attending: Internal Medicine

## 2019-12-30 DIAGNOSIS — Z20822 Contact with and (suspected) exposure to covid-19: Secondary | ICD-10-CM

## 2020-01-02 ENCOUNTER — Encounter: Payer: Self-pay | Admitting: *Deleted

## 2020-01-02 ENCOUNTER — Ambulatory Visit: Payer: Self-pay | Admitting: *Deleted

## 2020-01-02 NOTE — Telephone Encounter (Addendum)
Message from Valora Piccolo sent at 01/02/2020 11:42 AM EST  Carollee Herter T. Is calling from [facility] to ask if the patient COVID test can be restested. There is no name, DOB, Or identification on the sample. It is needed to be redrawn. CB- [phone] Option 1 [id number]    4:10 PM have tried to reach pt many times throughout day. Message left in MyChart asking her to re-test, number left for questions.

## 2020-01-03 LAB — NOVEL CORONAVIRUS, NAA

## 2020-01-09 ENCOUNTER — Ambulatory Visit: Payer: BLUE CROSS/BLUE SHIELD | Attending: Internal Medicine

## 2020-01-09 DIAGNOSIS — Z20822 Contact with and (suspected) exposure to covid-19: Secondary | ICD-10-CM

## 2020-01-10 LAB — NOVEL CORONAVIRUS, NAA: SARS-CoV-2, NAA: NOT DETECTED

## 2020-01-16 DIAGNOSIS — Z3480 Encounter for supervision of other normal pregnancy, unspecified trimester: Secondary | ICD-10-CM | POA: Insufficient documentation

## 2020-01-17 ENCOUNTER — Other Ambulatory Visit: Payer: Self-pay

## 2020-01-17 ENCOUNTER — Ambulatory Visit (INDEPENDENT_AMBULATORY_CARE_PROVIDER_SITE_OTHER): Payer: BLUE CROSS/BLUE SHIELD | Admitting: Obstetrics and Gynecology

## 2020-01-17 ENCOUNTER — Encounter: Payer: Self-pay | Admitting: Obstetrics and Gynecology

## 2020-01-17 DIAGNOSIS — Z3480 Encounter for supervision of other normal pregnancy, unspecified trimester: Secondary | ICD-10-CM

## 2020-01-17 DIAGNOSIS — Z3481 Encounter for supervision of other normal pregnancy, first trimester: Secondary | ICD-10-CM

## 2020-01-17 DIAGNOSIS — N898 Other specified noninflammatory disorders of vagina: Secondary | ICD-10-CM | POA: Diagnosis not present

## 2020-01-17 DIAGNOSIS — N760 Acute vaginitis: Secondary | ICD-10-CM | POA: Diagnosis not present

## 2020-01-17 DIAGNOSIS — Z113 Encounter for screening for infections with a predominantly sexual mode of transmission: Secondary | ICD-10-CM

## 2020-01-17 DIAGNOSIS — Z3A10 10 weeks gestation of pregnancy: Secondary | ICD-10-CM

## 2020-01-17 DIAGNOSIS — B9689 Other specified bacterial agents as the cause of diseases classified elsewhere: Secondary | ICD-10-CM | POA: Diagnosis not present

## 2020-01-17 NOTE — Progress Notes (Signed)
  Subjective:    Victoria Montgomery is a G2P0010 [redacted]w[redacted]d being seen today for her first obstetrical visit.  Her obstetrical history is significant for previous first trimester miscarriage. Patient does intend to breast feed. Pregnancy history fully reviewed.  Patient reports backache.  Vitals:   01/17/20 0919  BP: 125/77  Pulse: (!) 114  Weight: 119 lb (54 kg)    HISTORY: OB History  Gravida Para Term Preterm AB Living  2 0 0 0 1 0  SAB TAB Ectopic Multiple Live Births  0 0 1 0 0    # Outcome Date GA Lbr Len/2nd Weight Sex Delivery Anes PTL Lv  2 Current           1 Ectopic      SAB      History reviewed. No pertinent past medical history. Past Surgical History:  Procedure Laterality Date  . NO PAST SURGERIES     Family History  Problem Relation Age of Onset  . Diabetes Mother      Exam    Uterus:   10- week size  Pelvic Exam:    Perineum: Normal Perineum   Vulva: normal   Vagina:  normal mucosa, normal discharge   pH:    Cervix: nulliparous appearance and cervix is closed and long   Adnexa: no mass, fullness, tenderness   Bony Pelvis: gynecoid  System: Breast:  normal appearance, no masses or tenderness   Skin: normal coloration and turgor, no rashes    Neurologic: oriented, no focal deficits   Extremities: normal strength, tone, and muscle mass   HEENT extra ocular movement intact   Mouth/Teeth mucous membranes moist, pharynx normal without lesions and dental hygiene good   Neck supple and no masses   Cardiovascular: regular rate and rhythm   Respiratory:  appears well, vitals normal, no respiratory distress, acyanotic, normal RR, neck free of mass or lymphadenopathy   Abdomen: soft, non-tender; bowel sounds normal; no masses,  no organomegaly   Urinary:       Assessment:    Pregnancy: G2P0010 Patient Active Problem List   Diagnosis Date Noted  . Supervision of other normal pregnancy, antepartum 01/16/2020  . Miscarriage 10/10/2019        Plan:      Initial labs drawn. Prenatal vitamins. Problem list reviewed and updated. Genetic Screening discussed : panorama ordered.  Ultrasound discussed; fetal survey: ordered.  Follow up in 4 weeks. Patient is planning to transfer Care to [facility] due to inssurance 50% of 30 min visit spent on counseling and coordination of care.     Jaksen Fiorella 01/17/2020

## 2020-01-17 NOTE — Patient Instructions (Signed)
 First Trimester of Pregnancy The first trimester of pregnancy is from week 1 until the end of week 13 (months 1 through 3). A week after a sperm fertilizes an egg, the egg will implant on the wall of the uterus. This embryo will begin to develop into a baby. Genes from you and your partner will form the baby. The female genes will determine whether the baby will be a boy or a girl. At 6-8 weeks, the eyes and face will be formed, and the heartbeat can be seen on ultrasound. At the end of 12 weeks, all the baby's organs will be formed. Now that you are pregnant, you will want to do everything you can to have a healthy baby. Two of the most important things are to get good prenatal care and to follow your health care provider's instructions. Prenatal care is all the medical care you receive before the baby's birth. This care will help prevent, find, and treat any problems during the pregnancy and childbirth. Body changes during your first trimester Your body goes through many changes during pregnancy. The changes vary from woman to woman.  You may gain or lose a couple of pounds at first.  You may feel sick to your stomach (nauseous) and you may throw up (vomit). If the vomiting is uncontrollable, call your health care provider.  You may tire easily.  You may develop headaches that can be relieved by medicines. All medicines should be approved by your health care provider.  You may urinate more often. Painful urination may mean you have a bladder infection.  You may develop heartburn as a result of your pregnancy.  You may develop constipation because certain hormones are causing the muscles that push stool through your intestines to slow down.  You may develop hemorrhoids or swollen veins (varicose veins).  Your breasts may begin to grow larger and become tender. Your nipples may stick out more, and the tissue that surrounds them (areola) may become darker.  Your gums may bleed and may be  sensitive to brushing and flossing.  Dark spots or blotches (chloasma, mask of pregnancy) may develop on your face. This will likely fade after the baby is born.  Your menstrual periods will stop.  You may have a loss of appetite.  You may develop cravings for certain kinds of food.  You may have changes in your emotions from day to day, such as being excited to be pregnant or being concerned that something may go wrong with the pregnancy and baby.  You may have more vivid and strange dreams.  You may have changes in your hair. These can include thickening of your hair, rapid growth, and changes in texture. Some women also have hair loss during or after pregnancy, or hair that feels dry or thin. Your hair will most likely return to normal after your baby is born. What to expect at prenatal visits During a routine prenatal visit:  You will be weighed to make sure you and the baby are growing normally.  Your blood pressure will be taken.  Your abdomen will be measured to track your baby's growth.  The fetal heartbeat will be listened to between weeks 10 and 14 of your pregnancy.  Test results from any previous visits will be discussed. Your health care provider may ask you:  How you are feeling.  If you are feeling the baby move.  If you have had any abnormal symptoms, such as leaking fluid, bleeding, severe headaches, or   abdominal cramping.  If you are using any tobacco products, including cigarettes, chewing tobacco, and electronic cigarettes.  If you have any questions. Other tests that may be performed during your first trimester include:  Blood tests to find your blood type and to check for the presence of any previous infections. The tests will also be used to check for low iron levels (anemia) and protein on red blood cells (Rh antibodies). Depending on your risk factors, or if you previously had diabetes during pregnancy, you may have tests to check for high blood sugar  that affects pregnant women (gestational diabetes).  Urine tests to check for infections, diabetes, or protein in the urine.  An ultrasound to confirm the proper growth and development of the baby.  Fetal screens for spinal cord problems (spina bifida) and Down syndrome.  HIV (human immunodeficiency virus) testing. Routine prenatal testing includes screening for HIV, unless you choose not to have this test.  You may need other tests to make sure you and the baby are doing well. Follow these instructions at home: Medicines  Follow your health care provider's instructions regarding medicine use. Specific medicines may be either safe or unsafe to take during pregnancy.  Take a prenatal vitamin that contains at least 600 micrograms (mcg) of folic acid.  If you develop constipation, try taking a stool softener if your health care provider approves. Eating and drinking   Eat a balanced diet that includes fresh fruits and vegetables, whole grains, good sources of protein such as meat, eggs, or tofu, and low-fat dairy. Your health care provider will help you determine the amount of weight gain that is right for you.  Avoid raw meat and uncooked cheese. These carry germs that can cause birth defects in the baby.  Eating four or five small meals rather than three large meals a day may help relieve nausea and vomiting. If you start to feel nauseous, eating a few soda crackers can be helpful. Drinking liquids between meals, instead of during meals, also seems to help ease nausea and vomiting.  Limit foods that are high in fat and processed sugars, such as fried and sweet foods.  To prevent constipation: ? Eat foods that are high in fiber, such as fresh fruits and vegetables, whole grains, and beans. ? Drink enough fluid to keep your urine clear or pale yellow. Activity  Exercise only as directed by your health care provider. Most women can continue their usual exercise routine during  pregnancy. Try to exercise for 30 minutes at least 5 days a week. Exercising will help you: ? Control your weight. ? Stay in shape. ? Be prepared for labor and delivery.  Experiencing pain or cramping in the lower abdomen or lower back is a good sign that you should stop exercising. Check with your health care provider before continuing with normal exercises.  Try to avoid standing for long periods of time. Move your legs often if you must stand in one place for a long time.  Avoid heavy lifting.  Wear low-heeled shoes and practice good posture.  You may continue to have sex unless your health care provider tells you not to. Relieving pain and discomfort  Wear a good support bra to relieve breast tenderness.  Take warm sitz baths to soothe any pain or discomfort caused by hemorrhoids. Use hemorrhoid cream if your health care provider approves.  Rest with your legs elevated if you have leg cramps or low back pain.  If you develop varicose veins   in your legs, wear support hose. Elevate your feet for 15 minutes, 3-4 times a day. Limit salt in your diet. Prenatal care  Schedule your prenatal visits by the twelfth week of pregnancy. They are usually scheduled monthly at first, then more often in the last 2 months before delivery.  Write down your questions. Take them to your prenatal visits.  Keep all your prenatal visits as told by your health care provider. This is important. Safety  Wear your seat belt at all times when driving.  Make a list of emergency phone numbers, including numbers for family, friends, the hospital, and police and fire departments. General instructions  Ask your health care provider for a referral to a local prenatal education class. Begin classes no later than the beginning of month 6 of your pregnancy.  Ask for help if you have counseling or nutritional needs during pregnancy. Your health care provider can offer advice or refer you to specialists for help  with various needs.  Do not use hot tubs, steam rooms, or saunas.  Do not douche or use tampons or scented sanitary pads.  Do not cross your legs for long periods of time.  Avoid cat litter boxes and soil used by cats. These carry germs that can cause birth defects in the baby and possibly loss of the fetus by miscarriage or stillbirth.  Avoid all smoking, herbs, alcohol, and medicines not prescribed by your health care provider. Chemicals in these products affect the formation and growth of the baby.  Do not use any products that contain nicotine or tobacco, such as cigarettes and e-cigarettes. If you need help quitting, ask your health care provider. You may receive counseling support and other resources to help you quit.  Schedule a dentist appointment. At home, brush your teeth with a soft toothbrush and be gentle when you floss. Contact a health care provider if:  You have dizziness.  You have mild pelvic cramps, pelvic pressure, or nagging pain in the abdominal area.  You have persistent nausea, vomiting, or diarrhea.  You have a bad smelling vaginal discharge.  You have pain when you urinate.  You notice increased swelling in your face, hands, legs, or ankles.  You are exposed to fifth disease or chickenpox.  You are exposed to German measles (rubella) and have never had it. Get help right away if:  You have a fever.  You are leaking fluid from your vagina.  You have spotting or bleeding from your vagina.  You have severe abdominal cramping or pain.  You have rapid weight gain or loss.  You vomit blood or material that looks like coffee grounds.  You develop a severe headache.  You have shortness of breath.  You have any kind of trauma, such as from a fall or a car accident. Summary  The first trimester of pregnancy is from week 1 until the end of week 13 (months 1 through 3).  Your body goes through many changes during pregnancy. The changes vary from  woman to woman.  You will have routine prenatal visits. During those visits, your health care provider will examine you, discuss any test results you may have, and talk with you about how you are feeling. This information is not intended to replace advice given to you by your health care provider. Make sure you discuss any questions you have with your health care provider. Document Revised: 11/20/2017 Document Reviewed: 11/19/2016 [employer] Patient Education  2020 [employer]   Second Trimester of   Pregnancy The second trimester is from week 14 through week 27 (months 4 through 6). The second trimester is often a time when you feel your best. Your body has adjusted to being pregnant, and you begin to feel better physically. Usually, morning sickness has lessened or quit completely, you may have more energy, and you may have an increase in appetite. The second trimester is also a time when the fetus is growing rapidly. At the end of the sixth month, the fetus is about 9 inches long and weighs about 1 pounds. You will likely begin to feel the baby move (quickening) between 16 and 20 weeks of pregnancy. Body changes during your second trimester Your body continues to go through many changes during your second trimester. The changes vary from woman to woman.  Your weight will continue to increase. You will notice your lower abdomen bulging out.  You may begin to get stretch marks on your hips, abdomen, and breasts.  You may develop headaches that can be relieved by medicines. The medicines should be approved by your health care provider.  You may urinate more often because the fetus is pressing on your bladder.  You may develop or continue to have heartburn as a result of your pregnancy.  You may develop constipation because certain hormones are causing the muscles that push waste through your intestines to slow down.  You may develop hemorrhoids or swollen, bulging veins (varicose  veins).  You may have back pain. This is caused by: ? Weight gain. ? Pregnancy hormones that are relaxing the joints in your pelvis. ? A shift in weight and the muscles that support your balance.  Your breasts will continue to grow and they will continue to become tender.  Your gums may bleed and may be sensitive to brushing and flossing.  Dark spots or blotches (chloasma, mask of pregnancy) may develop on your face. This will likely fade after the baby is born.  A dark line from your belly button to the pubic area (linea nigra) may appear. This will likely fade after the baby is born.  You may have changes in your hair. These can include thickening of your hair, rapid growth, and changes in texture. Some women also have hair loss during or after pregnancy, or hair that feels dry or thin. Your hair will most likely return to normal after your baby is born. What to expect at prenatal visits During a routine prenatal visit:  You will be weighed to make sure you and the fetus are growing normally.  Your blood pressure will be taken.  Your abdomen will be measured to track your baby's growth.  The fetal heartbeat will be listened to.  Any test results from the previous visit will be discussed. Your health care provider may ask you:  How you are feeling.  If you are feeling the baby move.  If you have had any abnormal symptoms, such as leaking fluid, bleeding, severe headaches, or abdominal cramping.  If you are using any tobacco products, including cigarettes, chewing tobacco, and electronic cigarettes.  If you have any questions. Other tests that may be performed during your second trimester include:  Blood tests that check for: ? Low iron levels (anemia). ? High blood sugar that affects pregnant women (gestational diabetes) between 24 and 28 weeks. ? Rh antibodies. This is to check for a protein on red blood cells (Rh factor).  Urine tests to check for infections,  diabetes, or protein in the urine.    An ultrasound to confirm the proper growth and development of the baby.  An amniocentesis to check for possible genetic problems.  Fetal screens for spina bifida and Down syndrome.  HIV (human immunodeficiency virus) testing. Routine prenatal testing includes screening for HIV, unless you choose not to have this test. Follow these instructions at home: Medicines  Follow your health care provider's instructions regarding medicine use. Specific medicines may be either safe or unsafe to take during pregnancy.  Take a prenatal vitamin that contains at least 600 micrograms (mcg) of folic acid.  If you develop constipation, try taking a stool softener if your health care provider approves. Eating and drinking   Eat a balanced diet that includes fresh fruits and vegetables, whole grains, good sources of protein such as meat, eggs, or tofu, and low-fat dairy. Your health care provider will help you determine the amount of weight gain that is right for you.  Avoid raw meat and uncooked cheese. These carry germs that can cause birth defects in the baby.  If you have low calcium intake from food, talk to your health care provider about whether you should take a daily calcium supplement.  Limit foods that are high in fat and processed sugars, such as fried and sweet foods.  To prevent constipation: ? Drink enough fluid to keep your urine clear or pale yellow. ? Eat foods that are high in fiber, such as fresh fruits and vegetables, whole grains, and beans. Activity  Exercise only as directed by your health care provider. Most women can continue their usual exercise routine during pregnancy. Try to exercise for 30 minutes at least 5 days a week. Stop exercising if you experience uterine contractions.  Avoid heavy lifting, wear low heel shoes, and practice good posture.  A sexual relationship may be continued unless your health care provider directs you  otherwise. Relieving pain and discomfort  Wear a good support bra to prevent discomfort from breast tenderness.  Take warm sitz baths to soothe any pain or discomfort caused by hemorrhoids. Use hemorrhoid cream if your health care provider approves.  Rest with your legs elevated if you have leg cramps or low back pain.  If you develop varicose veins, wear support hose. Elevate your feet for 15 minutes, 3-4 times a day. Limit salt in your diet. Prenatal Care  Write down your questions. Take them to your prenatal visits.  Keep all your prenatal visits as told by your health care provider. This is important. Safety  Wear your seat belt at all times when driving.  Make a list of emergency phone numbers, including numbers for family, friends, the hospital, and police and fire departments. General instructions  Ask your health care provider for a referral to a local prenatal education class. Begin classes no later than the beginning of month 6 of your pregnancy.  Ask for help if you have counseling or nutritional needs during pregnancy. Your health care provider can offer advice or refer you to specialists for help with various needs.  Do not use hot tubs, steam rooms, or saunas.  Do not douche or use tampons or scented sanitary pads.  Do not cross your legs for long periods of time.  Avoid cat litter boxes and soil used by cats. These carry germs that can cause birth defects in the baby and possibly loss of the fetus by miscarriage or stillbirth.  Avoid all smoking, herbs, alcohol, and unprescribed drugs. Chemicals in these products can affect the formation and growth of   the baby.  Do not use any products that contain nicotine or tobacco, such as cigarettes and e-cigarettes. If you need help quitting, ask your health care provider.  Visit your dentist if you have not gone yet during your pregnancy. Use a soft toothbrush to brush your teeth and be gentle when you floss. Contact a  health care provider if:  You have dizziness.  You have mild pelvic cramps, pelvic pressure, or nagging pain in the abdominal area.  You have persistent nausea, vomiting, or diarrhea.  You have a bad smelling vaginal discharge.  You have pain when you urinate. Get help right away if:  You have a fever.  You are leaking fluid from your vagina.  You have spotting or bleeding from your vagina.  You have severe abdominal cramping or pain.  You have rapid weight gain or weight loss.  You have shortness of breath with chest pain.  You notice sudden or extreme swelling of your face, hands, ankles, feet, or legs.  You have not felt your baby move in over an hour.  You have severe headaches that do not go away when you take medicine.  You have vision changes. Summary  The second trimester is from week 14 through week 27 (months 4 through 6). It is also a time when the fetus is growing rapidly.  Your body goes through many changes during pregnancy. The changes vary from woman to woman.  Avoid all smoking, herbs, alcohol, and unprescribed drugs. These chemicals affect the formation and growth your baby.  Do not use any tobacco products, such as cigarettes, chewing tobacco, and e-cigarettes. If you need help quitting, ask your health care provider.  Contact your health care provider if you have any questions. Keep all prenatal visits as told by your health care provider. This is important. This information is not intended to replace advice given to you by your health care provider. Make sure you discuss any questions you have with your health care provider. Document Revised: 04/01/2019 Document Reviewed: 01/13/2017 [employer] Patient Education  2020 [employer]   Contraception Choices Contraception, also called birth control, refers to methods or devices that prevent pregnancy. Hormonal methods Contraceptive implant  A contraceptive implant is a thin, plastic tube that  contains a hormone. It is inserted into the upper part of the arm. It can remain in place for up to 3 years. Progestin-only injections Progestin-only injections are injections of progestin, a synthetic form of the hormone progesterone. They are given every 3 months by a health care provider. Birth control pills  Birth control pills are pills that contain hormones that prevent pregnancy. They must be taken once a day, preferably at the same time each day. Birth control patch  The birth control patch contains hormones that prevent pregnancy. It is placed on the skin and must be changed once a week for three weeks and removed on the fourth week. A prescription is needed to use this method of contraception. Vaginal ring  A vaginal ring contains hormones that prevent pregnancy. It is placed in the vagina for three weeks and removed on the fourth week. After that, the process is repeated with a new ring. A prescription is needed to use this method of contraception. Emergency contraceptive Emergency contraceptives prevent pregnancy after unprotected sex. They come in pill form and can be taken up to 5 days after sex. They work best the sooner they are taken after having sex. Most emergency contraceptives are available without a prescription. This   method should not be used as your only form of birth control. Barrier methods Female condom  A female condom is a thin sheath that is worn over the penis during sex. Condoms keep sperm from going inside a woman's body. They can be used with a spermicide to increase their effectiveness. They should be disposed after a single use. Female condom  A female condom is a soft, loose-fitting sheath that is put into the vagina before sex. The condom keeps sperm from going inside a woman's body. They should be disposed after a single use. Diaphragm  A diaphragm is a soft, dome-shaped barrier. It is inserted into the vagina before sex, along with a spermicide. The  diaphragm blocks sperm from entering the uterus, and the spermicide kills sperm. A diaphragm should be left in the vagina for 6-8 hours after sex and removed within 24 hours. A diaphragm is prescribed and fitted by a health care provider. A diaphragm should be replaced every 1-2 years, after giving birth, after gaining more than 15 lb (6.8 kg), and after pelvic surgery. Cervical cap  A cervical cap is a round, soft latex or plastic cup that fits over the cervix. It is inserted into the vagina before sex, along with spermicide. It blocks sperm from entering the uterus. The cap should be left in place for 6-8 hours after sex and removed within 48 hours. A cervical cap must be prescribed and fitted by a health care provider. It should be replaced every 2 years. Sponge  A sponge is a soft, circular piece of polyurethane foam with spermicide on it. The sponge helps block sperm from entering the uterus, and the spermicide kills sperm. To use it, you make it wet and then insert it into the vagina. It should be inserted before sex, left in for at least 6 hours after sex, and removed and thrown away within 30 hours. Spermicides Spermicides are chemicals that kill or block sperm from entering the cervix and uterus. They can come as a cream, jelly, suppository, foam, or tablet. A spermicide should be inserted into the vagina with an applicator at least 10-15 minutes before sex to allow time for it to work. The process must be repeated every time you have sex. Spermicides do not require a prescription. Intrauterine contraception Intrauterine device (IUD) An IUD is a T-shaped device that is put in a woman's uterus. There are two types:  Hormone IUD.This type contains progestin, a synthetic form of the hormone progesterone. This type can stay in place for 3-5 years.  Copper IUD.This type is wrapped in copper wire. It can stay in place for 10 years.  Permanent methods of contraception Female tubal ligation In  this method, a woman's fallopian tubes are sealed, tied, or blocked during surgery to prevent eggs from traveling to the uterus. Hysteroscopic sterilization In this method, a small, flexible insert is placed into each fallopian tube. The inserts cause scar tissue to form in the fallopian tubes and block them, so sperm cannot reach an egg. The procedure takes about 3 months to be effective. Another form of birth control must be used during those 3 months. Female sterilization This is a procedure to tie off the tubes that carry sperm (vasectomy). After the procedure, the man can still ejaculate fluid (semen). Natural planning methods Natural family planning In this method, a couple does not have sex on days when the woman could become pregnant. Calendar method This means keeping track of the length of each menstrual cycle,   identifying the days when pregnancy can happen, and not having sex on those days. Ovulation method In this method, a couple avoids sex during ovulation. Symptothermal method This method involves not having sex during ovulation. The woman typically checks for ovulation by watching changes in her temperature and in the consistency of cervical mucus. Post-ovulation method In this method, a couple waits to have sex until after ovulation. Summary  Contraception, also called birth control, means methods or devices that prevent pregnancy.  Hormonal methods of contraception include implants, injections, pills, patches, vaginal rings, and emergency contraceptives.  Barrier methods of contraception can include female condoms, female condoms, diaphragms, cervical caps, sponges, and spermicides.  There are two types of IUDs (intrauterine devices). An IUD can be put in a woman's uterus to prevent pregnancy for 3-5 years.  Permanent sterilization can be done through a procedure for males, females, or both.  Natural family planning methods involve not having sex on days when the woman could  become pregnant. This information is not intended to replace advice given to you by your health care provider. Make sure you discuss any questions you have with your health care provider. Document Revised: 12/10/2017 Document Reviewed: 01/10/2017 Elsevier Patient Education  2020 [employer] Inc.   Breastfeeding  Choosing to breastfeed is one of the best decisions you can make for yourself and your baby. A change in hormones during pregnancy causes your breasts to make breast milk in your milk-producing glands. Hormones prevent breast milk from being released before your baby is born. They also prompt milk flow after birth. Once breastfeeding has begun, thoughts of your baby, as well as his or her sucking or crying, can stimulate the release of milk from your milk-producing glands. Benefits of breastfeeding Research shows that breastfeeding offers many health benefits for infants and mothers. It also offers a cost-free and convenient way to feed your baby. For your baby  Your first milk (colostrum) helps your baby's digestive system to function better.  Special cells in your milk (antibodies) help your baby to fight off infections.  Breastfed babies are less likely to develop asthma, allergies, obesity, or type 2 diabetes. They are also at lower risk for sudden infant death syndrome (SIDS).  Nutrients in breast milk are better able to meet your baby's needs compared to infant formula.  Breast milk improves your baby's brain development. For you  Breastfeeding helps to create a very special bond between you and your baby.  Breastfeeding is convenient. Breast milk costs nothing and is always available at the correct temperature.  Breastfeeding helps to burn calories. It helps you to lose the weight that you gained during pregnancy.  Breastfeeding makes your uterus return faster to its size before pregnancy. It also slows bleeding (lochia) after you give birth.  Breastfeeding helps to lower  your risk of developing type 2 diabetes, osteoporosis, rheumatoid arthritis, cardiovascular disease, and breast, ovarian, uterine, and endometrial cancer later in life. Breastfeeding basics Starting breastfeeding  Find a comfortable place to sit or lie down, with your neck and back well-supported.  Place a pillow or a rolled-up blanket under your baby to bring him or her to the level of your breast (if you are seated). Nursing pillows are specially designed to help support your arms and your baby while you breastfeed.  Make sure that your baby's tummy (abdomen) is facing your abdomen.  Gently massage your breast. With your fingertips, massage from the outer edges of your breast inward toward the nipple. This encourages   milk flow. If your milk flows slowly, you may need to continue this action during the feeding.  Support your breast with 4 fingers underneath and your thumb above your nipple (make the letter "C" with your hand). Make sure your fingers are well away from your nipple and your baby's mouth.  Stroke your baby's lips gently with your finger or nipple.  When your baby's mouth is open wide enough, quickly bring your baby to your breast, placing your entire nipple and as much of the areola as possible into your baby's mouth. The areola is the colored area around your nipple. ? More areola should be visible above your baby's upper lip than below the lower lip. ? Your baby's lips should be opened and extended outward (flanged) to ensure an adequate, comfortable latch. ? Your baby's tongue should be between his or her lower gum and your breast.  Make sure that your baby's mouth is correctly positioned around your nipple (latched). Your baby's lips should create a seal on your breast and be turned out (everted).  It is common for your baby to suck about 2-3 minutes in order to start the flow of breast milk. Latching Teaching your baby how to latch onto your breast properly is very  important. An improper latch can cause nipple pain, decreased milk supply, and poor weight gain in your baby. Also, if your baby is not latched onto your nipple properly, he or she may swallow some air during feeding. This can make your baby fussy. Burping your baby when you switch breasts during the feeding can help to get rid of the air. However, teaching your baby to latch on properly is still the best way to prevent fussiness from swallowing air while breastfeeding. Signs that your baby has successfully latched onto your nipple  Silent tugging or silent sucking, without causing you pain. Infant's lips should be extended outward (flanged).  Swallowing heard between every 3-4 sucks once your milk has started to flow (after your let-down milk reflex occurs).  Muscle movement above and in front of his or her ears while sucking. Signs that your baby has not successfully latched onto your nipple  Sucking sounds or smacking sounds from your baby while breastfeeding.  Nipple pain. If you think your baby has not latched on correctly, slip your finger into the corner of your baby's mouth to break the suction and place it between your baby's gums. Attempt to start breastfeeding again. Signs of successful breastfeeding Signs from your baby  Your baby will gradually decrease the number of sucks or will completely stop sucking.  Your baby will fall asleep.  Your baby's body will relax.  Your baby will retain a small amount of milk in his or her mouth.  Your baby will let go of your breast by himself or herself. Signs from you  Breasts that have increased in firmness, weight, and size 1-3 hours after feeding.  Breasts that are softer immediately after breastfeeding.  Increased milk volume, as well as a change in milk consistency and color by the fifth day of breastfeeding.  Nipples that are not sore, cracked, or bleeding. Signs that your baby is getting enough milk  Wetting at least 1-2  diapers during the first 24 hours after birth.  Wetting at least 5-6 diapers every 24 hours for the first week after birth. The urine should be clear or pale yellow by the age of 5 days.  Wetting 6-8 diapers every 24 hours as your baby continues   to grow and develop.  At least 3 stools in a 24-hour period by the age of 5 days. The stool should be soft and yellow.  At least 3 stools in a 24-hour period by the age of 7 days. The stool should be seedy and yellow.  No loss of weight greater than 10% of birth weight during the first 3 days of life.  Average weight gain of 4-7 oz (113-198 g) per week after the age of 4 days.  Consistent daily weight gain by the age of 5 days, without weight loss after the age of 2 weeks. After a feeding, your baby may spit up a small amount of milk. This is normal. Breastfeeding frequency and duration Frequent feeding will help you make more milk and can prevent sore nipples and extremely full breasts (breast engorgement). Breastfeed when you feel the need to reduce the fullness of your breasts or when your baby shows signs of hunger. This is called "breastfeeding on demand." Signs that your baby is hungry include:  Increased alertness, activity, or restlessness.  Movement of the head from side to side.  Opening of the mouth when the corner of the mouth or cheek is stroked (rooting).  Increased sucking sounds, smacking lips, cooing, sighing, or squeaking.  Hand-to-mouth movements and sucking on fingers or hands.  Fussing or crying. Avoid introducing a pacifier to your baby in the first 4-6 weeks after your baby is born. After this time, you may choose to use a pacifier. Research has shown that pacifier use during the first year of a baby's life decreases the risk of sudden infant death syndrome (SIDS). Allow your baby to feed on each breast as long as he or she wants. When your baby unlatches or falls asleep while feeding from the first breast, offer the  second breast. Because newborns are often sleepy in the first few weeks of life, you may need to awaken your baby to get him or her to feed. Breastfeeding times will vary from baby to baby. However, the following rules can serve as a guide to help you make sure that your baby is properly fed:  Newborns (babies 4 weeks of age or younger) may breastfeed every 1-3 hours.  Newborns should not go without breastfeeding for longer than 3 hours during the day or 5 hours during the night.  You should breastfeed your baby a minimum of 8 times in a 24-hour period. Breast milk pumping     Pumping and storing breast milk allows you to make sure that your baby is exclusively fed your breast milk, even at times when you are unable to breastfeed. This is especially important if you go back to work while you are still breastfeeding, or if you are not able to be present during feedings. Your lactation consultant can help you find a method of pumping that works best for you and give you guidelines about how long it is safe to store breast milk. Caring for your breasts while you breastfeed Nipples can become dry, cracked, and sore while breastfeeding. The following recommendations can help keep your breasts moisturized and healthy:  Avoid using soap on your nipples.  Wear a supportive bra designed especially for nursing. Avoid wearing underwire-style bras or extremely tight bras (sports bras).  Air-dry your nipples for 3-4 minutes after each feeding.  Use only cotton bra pads to absorb leaked breast milk. Leaking of breast milk between feedings is normal.  Use lanolin on your nipples after breastfeeding. Lanolin helps   to maintain your skin's normal moisture barrier. Pure lanolin is not harmful (not toxic) to your baby. You may also hand express a few drops of breast milk and gently massage that milk into your nipples and allow the milk to air-dry. In the first few weeks after giving birth, some women experience  breast engorgement. Engorgement can make your breasts feel heavy, warm, and tender to the touch. Engorgement peaks within 3-5 days after you give birth. The following recommendations can help to ease engorgement:  Completely empty your breasts while breastfeeding or pumping. You may want to start by applying warm, moist heat (in the shower or with warm, water-soaked hand towels) just before feeding or pumping. This increases circulation and helps the milk flow. If your baby does not completely empty your breasts while breastfeeding, pump any extra milk after he or she is finished.  Apply ice packs to your breasts immediately after breastfeeding or pumping, unless this is too uncomfortable for you. To do this: ? Put ice in a plastic bag. ? Place a towel between your skin and the bag. ? Leave the ice on for 20 minutes, 2-3 times a day.  Make sure that your baby is latched on and positioned properly while breastfeeding. If engorgement persists after 48 hours of following these recommendations, contact your health care provider or a lactation consultant. Overall health care recommendations while breastfeeding  Eat 3 healthy meals and 3 snacks every day. Well-nourished mothers who are breastfeeding need an additional 450-500 calories a day. You can meet this requirement by increasing the amount of a balanced diet that you eat.  Drink enough water to keep your urine pale yellow or clear.  Rest often, relax, and continue to take your prenatal vitamins to prevent fatigue, stress, and low vitamin and mineral levels in your body (nutrient deficiencies).  Do not use any products that contain nicotine or tobacco, such as cigarettes and e-cigarettes. Your baby may be harmed by chemicals from cigarettes that pass into breast milk and exposure to secondhand smoke. If you need help quitting, ask your health care provider.  Avoid alcohol.  Do not use illegal drugs or marijuana.  Talk with your health care  provider before taking any medicines. These include over-the-counter and prescription medicines as well as vitamins and herbal supplements. Some medicines that may be harmful to your baby can pass through breast milk.  It is possible to become pregnant while breastfeeding. If birth control is desired, ask your health care provider about options that will be safe while breastfeeding your baby. Where to find more information: [employer]: www.llli.org Contact a health care provider if:  You feel like you want to stop breastfeeding or have become frustrated with breastfeeding.  Your nipples are cracked or bleeding.  Your breasts are red, tender, or warm.  You have: ? Painful breasts or nipples. ? A swollen area on either breast. ? A fever or chills. ? Nausea or vomiting. ? Drainage other than breast milk from your nipples.  Your breasts do not become full before feedings by the fifth day after you give birth.  You feel sad and depressed.  Your baby is: ? Too sleepy to eat well. ? Having trouble sleeping. ? More than 1 week old and wetting fewer than 6 diapers in a 24-hour period. ? Not gaining weight by 5 days of age.  Your baby has fewer than 3 stools in a 24-hour period.  Your baby's skin or the white parts of   his or her eyes become yellow. Get help right away if:  Your baby is overly tired (lethargic) and does not want to wake up and feed.  Your baby develops an unexplained fever. Summary  Breastfeeding offers many health benefits for infant and mothers.  Try to breastfeed your infant when he or she shows early signs of hunger.  Gently tickle or stroke your baby's lips with your finger or nipple to allow the baby to open his or her mouth. Bring the baby to your breast. Make sure that much of the areola is in your baby's mouth. Offer one side and burp the baby before you offer the other side.  Talk with your health care provider or lactation consultant  if you have questions or you face problems as you breastfeed. This information is not intended to replace advice given to you by your health care provider. Make sure you discuss any questions you have with your health care provider. Document Revised: 03/04/2018 Document Reviewed: 01/09/2017 Elsevier Patient Education  2020 Elsevier Inc.  

## 2020-01-18 LAB — OBSTETRIC PANEL, INCLUDING HIV
Antibody Screen: NEGATIVE
Basophils Absolute: 0 10*3/uL (ref 0.0–0.2)
Basos: 0 %
EOS (ABSOLUTE): 0.1 10*3/uL (ref 0.0–0.4)
Eos: 2 %
HIV Screen 4th Generation wRfx: NONREACTIVE
Hematocrit: 40.1 % (ref 34.0–46.6)
Hemoglobin: 12.8 g/dL (ref 11.1–15.9)
Hepatitis B Surface Ag: NEGATIVE
Immature Grans (Abs): 0 10*3/uL (ref 0.0–0.1)
Immature Granulocytes: 0 %
Lymphocytes Absolute: 2.1 10*3/uL (ref 0.7–3.1)
Lymphs: 29 %
MCH: 26.1 pg — ABNORMAL LOW (ref 26.6–33.0)
MCHC: 31.9 g/dL (ref 31.5–35.7)
MCV: 82 fL (ref 79–97)
Monocytes Absolute: 0.4 10*3/uL (ref 0.1–0.9)
Monocytes: 6 %
Neutrophils Absolute: 4.6 10*3/uL (ref 1.4–7.0)
Neutrophils: 63 %
Platelets: 338 10*3/uL (ref 150–450)
RBC: 4.91 x10E6/uL (ref 3.77–5.28)
RDW: 13.1 % (ref 11.7–15.4)
RPR Ser Ql: NONREACTIVE
Rh Factor: POSITIVE
Rubella Antibodies, IGG: 2.73 index (ref >0.99)
WBC: 7.3 10*3/uL (ref 3.4–10.8)

## 2020-01-18 LAB — CERVICOVAGINAL ANCILLARY ONLY
Bacterial Vaginitis (gardnerella): POSITIVE — AB
Candida Glabrata: NEGATIVE
Candida Vaginitis: NEGATIVE
Chlamydia: NEGATIVE
Comment: NEGATIVE
Comment: NEGATIVE
Comment: NEGATIVE
Comment: NEGATIVE
Comment: NEGATIVE
Comment: NORMAL
Neisseria Gonorrhea: NEGATIVE
Trichomonas: NEGATIVE

## 2020-01-19 MED ORDER — METRONIDAZOLE 500 MG PO TABS: 500.0000 mg | ORAL_TABLET | Freq: Two times a day (BID) | ORAL | 0 refills | Status: AC

## 2020-01-19 NOTE — Addendum Note (Signed)
Addended by: Catalina Antigua on: 01/19/2020 11:04 AM   Modules accepted: Orders

## 2020-01-20 LAB — CULTURE, OB URINE

## 2020-01-20 LAB — URINE CULTURE, OB REFLEX

## 2020-01-25 ENCOUNTER — Encounter: Payer: Self-pay | Admitting: Obstetrics and Gynecology

## 2020-01-26 ENCOUNTER — Encounter: Payer: Self-pay | Admitting: Obstetrics and Gynecology

## 2020-02-17 ENCOUNTER — Encounter: Payer: Self-pay | Admitting: Certified Nurse Midwife

## 2020-02-20 DIAGNOSIS — Z3480 Encounter for supervision of other normal pregnancy, unspecified trimester: Secondary | ICD-10-CM

## 2020-02-20 MED ORDER — OB COMPLETE PETITE 35-5-1-200 MG PO CAPS: 1.0000 | ORAL_CAPSULE | Freq: Once | ORAL | 11 refills | Status: AC

## 2020-03-13 ENCOUNTER — Encounter: Payer: BLUE CROSS/BLUE SHIELD | Admitting: Certified Nurse Midwife

## 2020-04-14 ENCOUNTER — Ambulatory Visit: Payer: Medicaid Other | Attending: Internal Medicine

## 2020-04-14 DIAGNOSIS — Z23 Encounter for immunization: Secondary | ICD-10-CM

## 2020-04-14 NOTE — Progress Notes (Signed)
   Covid-19 Vaccination Clinic  Name:  Victoria Montgomery    MRN: [id number] DOB: June 15, 1991  04/14/2020  [name] was observed post Covid-19 immunization for 15 minutes without incident. She was provided with Vaccine Information Sheet and instruction to access the V-Safe system.   [name] was instructed to call 911 with any severe reactions post vaccine: Marland Kitchen Difficulty breathing  . Swelling of face and throat  . A fast heartbeat  . A bad rash all over body  . Dizziness and weakness   Immunizations Administered    Name Date Dose VIS Date Route   Pfizer COVID-19 Vaccine 04/14/2020 11:45 AM 0.3 mL 02/15/2019 Intramuscular   Manufacturer: [employer], [employer]   Lot: [id number]   NDC: [address]-4

## 2020-04-16 IMAGING — US US PELVIS COMPLETE WITH TRANSVAGINAL
1 series · 13 of 25 positions shown · non-contrast
Comparison: None

CLINICAL DATA: Infertility.  Evaluate for fibroids.

EXAM:
TRANSABDOMINAL AND TRANSVAGINAL ULTRASOUND OF PELVIS
TECHNIQUE: Both transabdominal and transvaginal ultrasound examinations of the
pelvis were performed. Transabdominal technique was performed for
global imaging of the pelvis including uterus, ovaries, adnexal
regions, and pelvic cul-de-sac. It was necessary to proceed with
endovaginal exam following the transabdominal exam to visualize the
endometrium and ovaries.

[Series 1: us pelvis complete with transvaginal · 0.20mm/px · 84 acquisitions, 13 frames shown]
[im 1/84]
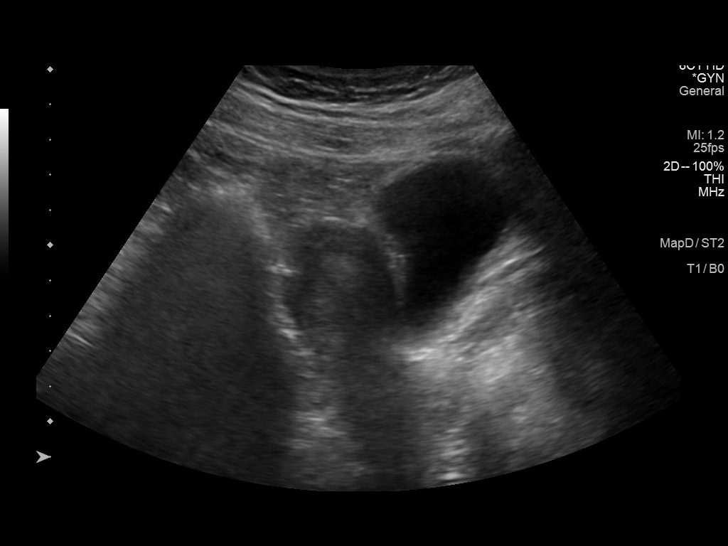
[im 7/84]
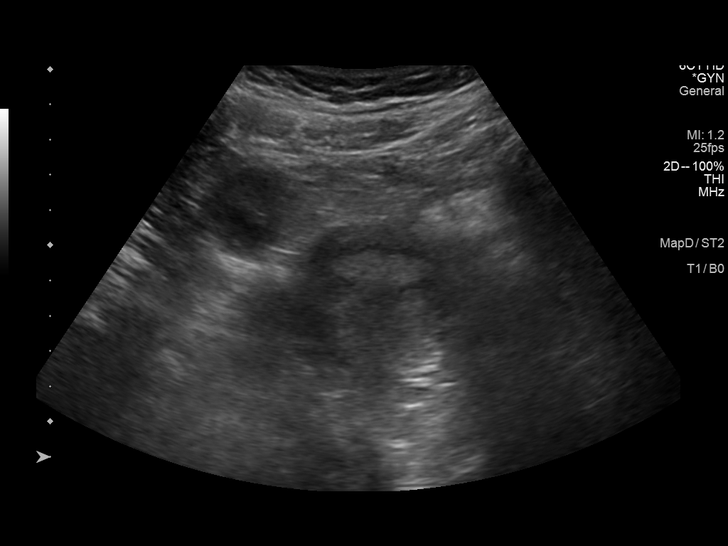
[im 14/84]
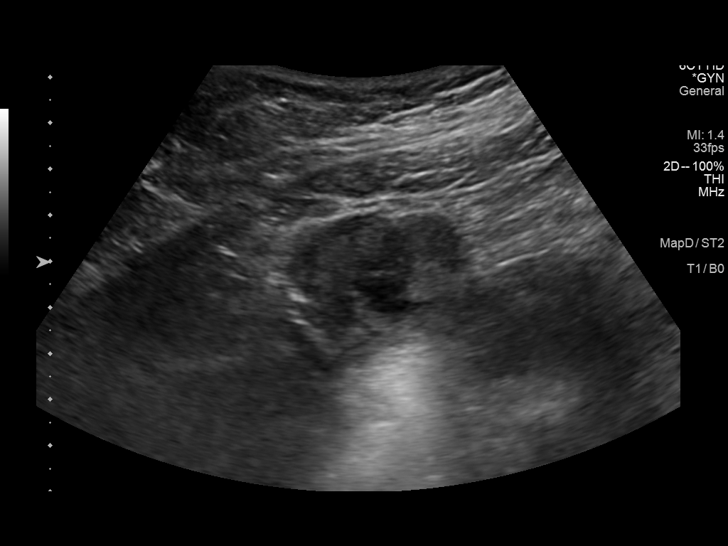
[im 21/84]
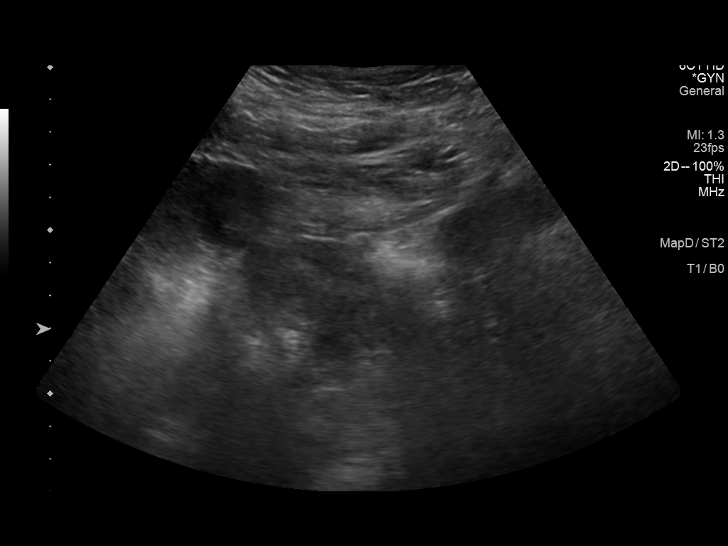
[im 28/84]
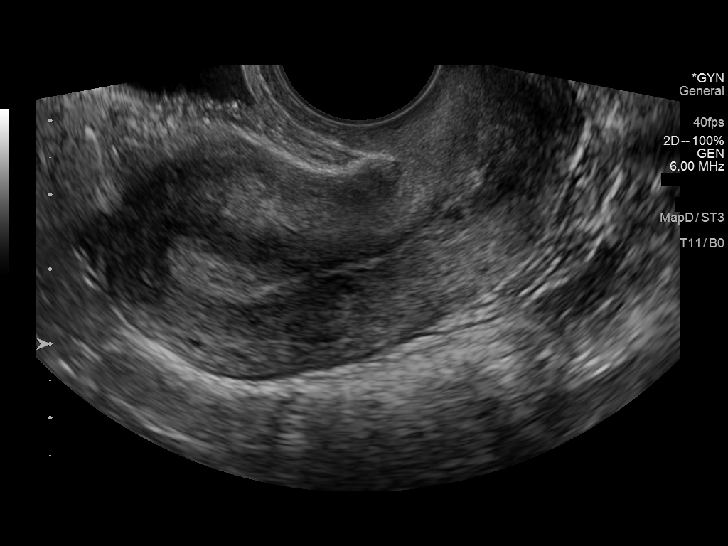
[im 35/84]
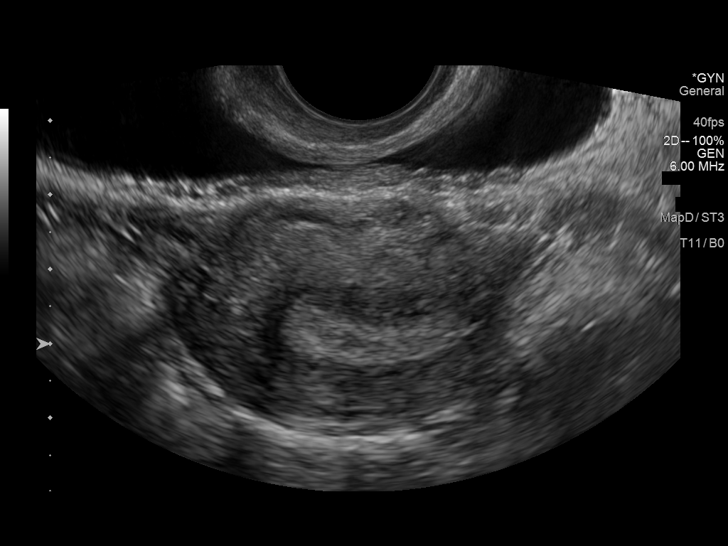
[im 42/84]
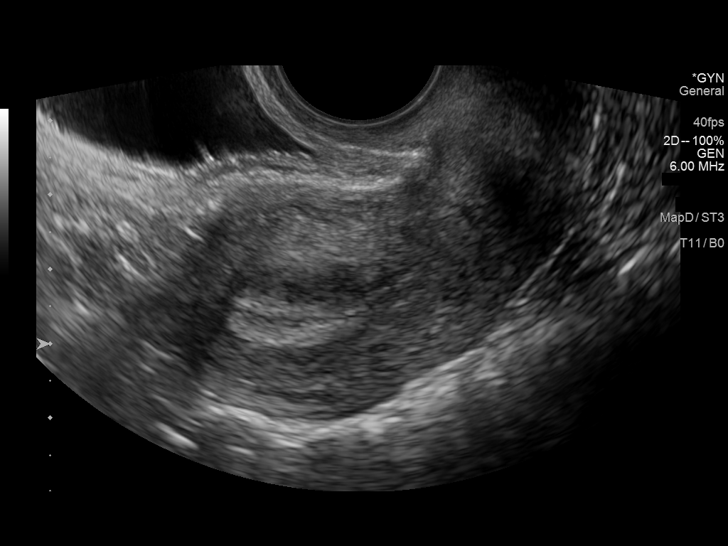
[im 49/84]
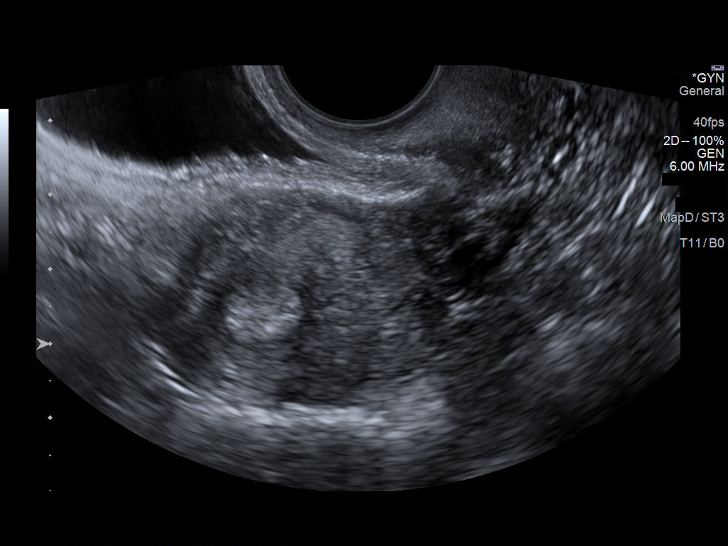
[im 56/84]
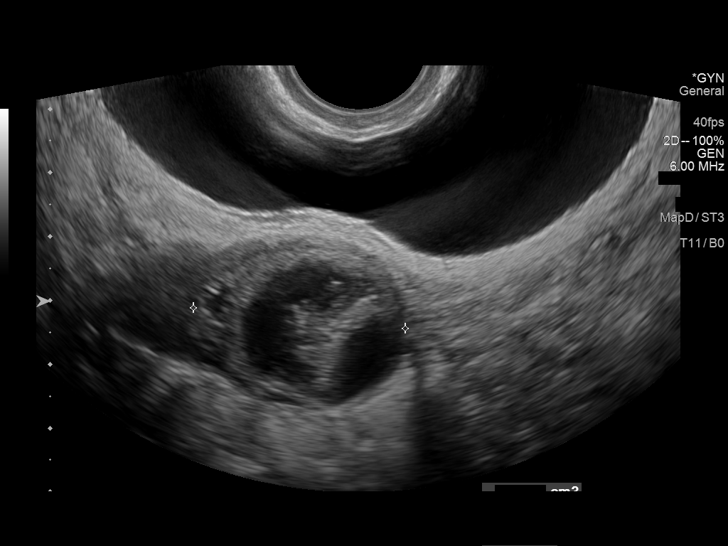
[im 63/84]
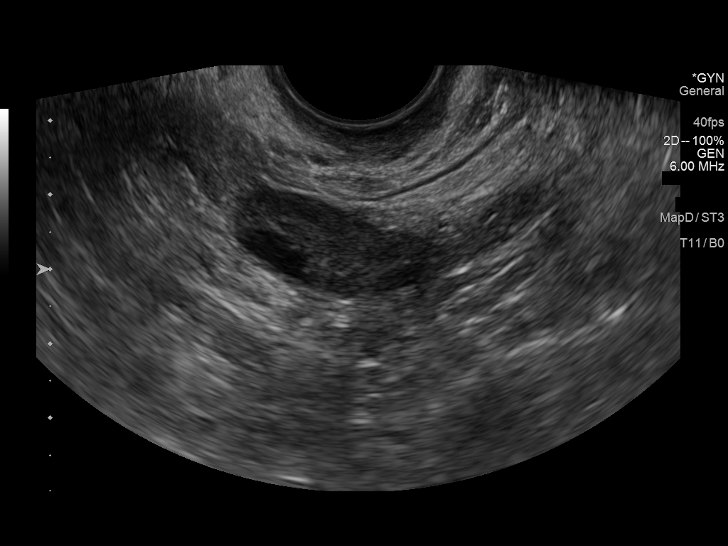
[im 70/84]
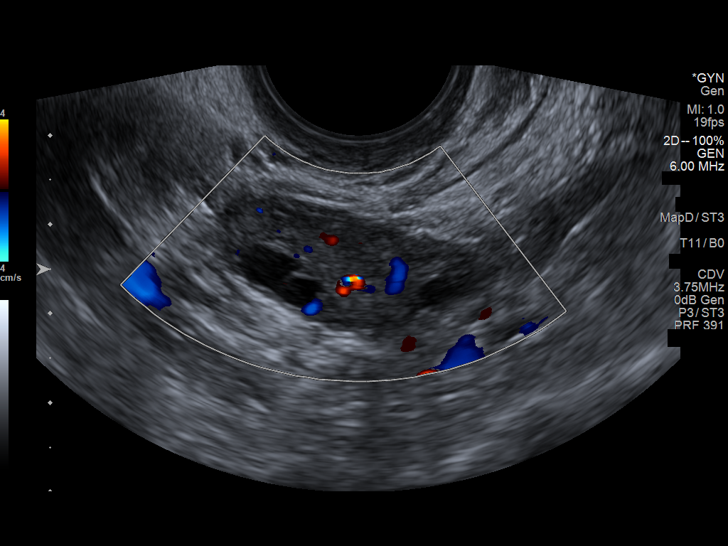
[im 77/84]
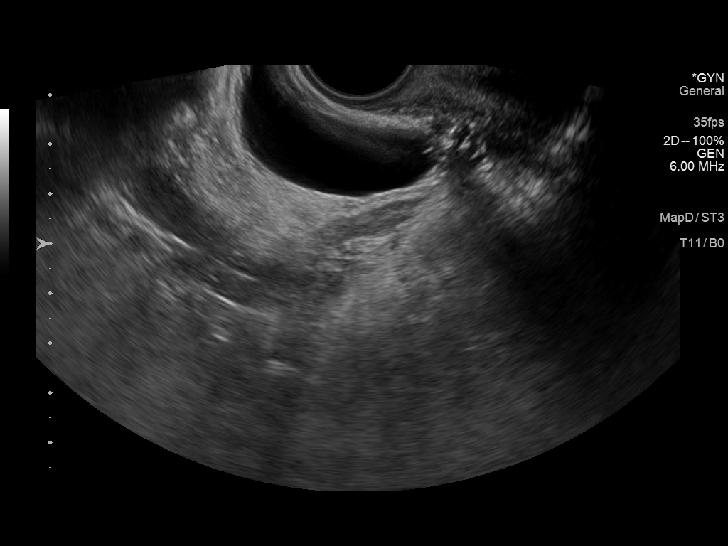
[im 84/84]
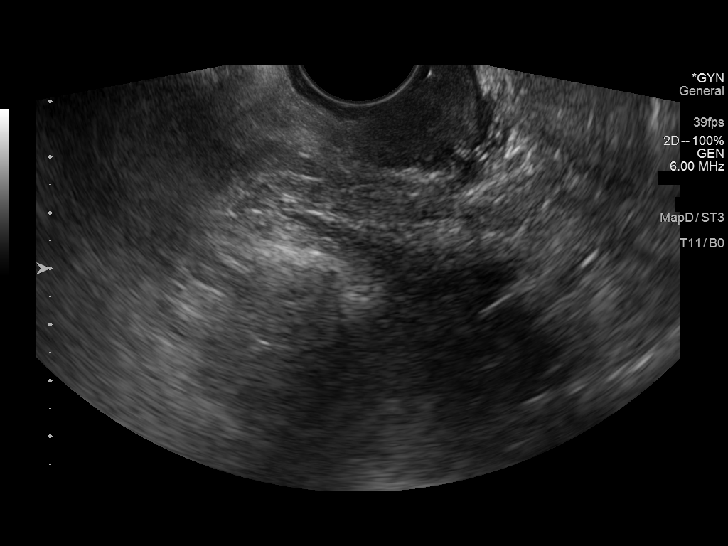

[13 of 25 positions shown; findings below may reference images not displayed]

FINDINGS: Uterus

Measurements: 6.9 x 3.1 x 4.7 cm = volume: 52 mL. No fibroids or
other mass visualized.

Endometrium

Thickness: 7 mm.  No focal abnormality visualized.

Right ovary

Measurements: 4.0 x 2.6 x 4.6 cm = volume: 25.4 mL. A complex cystic
lesion is seen which measures 3.3 x 2.8 x 2.4 cm. This shows several
thick internal septations and low-level internal echoes, but no
definite internal blood flow on color Doppler ultrasound. This has
indeterminate characteristics, but may represent a hemorrhagic
ovarian cyst.

Left ovary

Measurements: 3.4 x 1.4 x 1.5 cm = volume: 3.6 mL. Normal
appearance/no adnexal mass.

Other findings

No abnormal free fluid.
IMPRESSION: Normal appearance of uterus.  No evidence of fibroids.

3.3 cm complex cystic lesion in right ovary has indeterminate
characteristics, but may represent a hemorrhagic cyst. Recommend
follow-up with transvaginal pelvic ultrasound in 6-12 weeks. This
recommendation follows the consensus statement: Management of
Asymptomatic Ovarian and Other Adnexal Cysts Imaged at US: Society
of Radiologists in Ultrasound Consensus Conference Statement.

## 2020-04-23 ENCOUNTER — Other Ambulatory Visit: Payer: Medicaid Other

## 2020-05-07 ENCOUNTER — Ambulatory Visit: Payer: Medicaid Other

## 2020-06-19 IMAGING — US US OB TRANSVAGINAL
1 series · 15 of 28 positions shown · non-contrast
Comparison: 09/22/2019

CLINICAL DATA: Left lower quadrant abdominal pain. Methotrexate
given on 09/24/2019. Seven weeks and 3 days pregnant by last
menstrual period. Decreasing quantitative beta HCG.

EXAM:
TRANSVAGINAL OB ULTRASOUND
TECHNIQUE: Transvaginal ultrasound was performed for complete evaluation of the
gestation as well as the maternal uterus, adnexal regions, and
pelvic cul-de-sac.

[Series 1: us ob transvaginal · 15 of 30 slices shown]
[im 1/30]
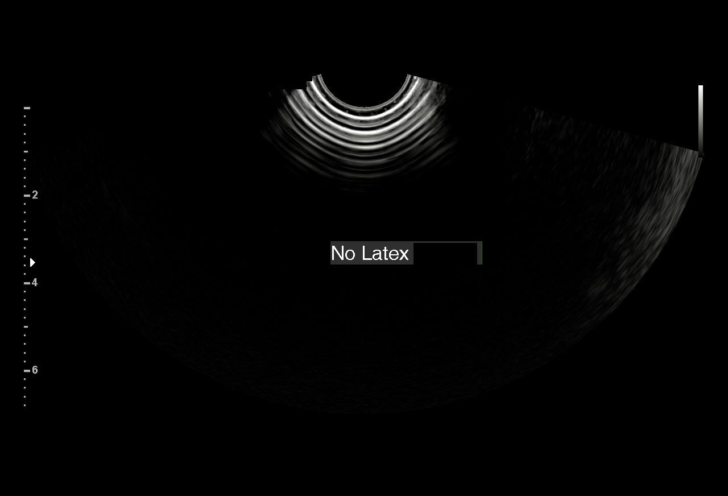
[im 3/30]
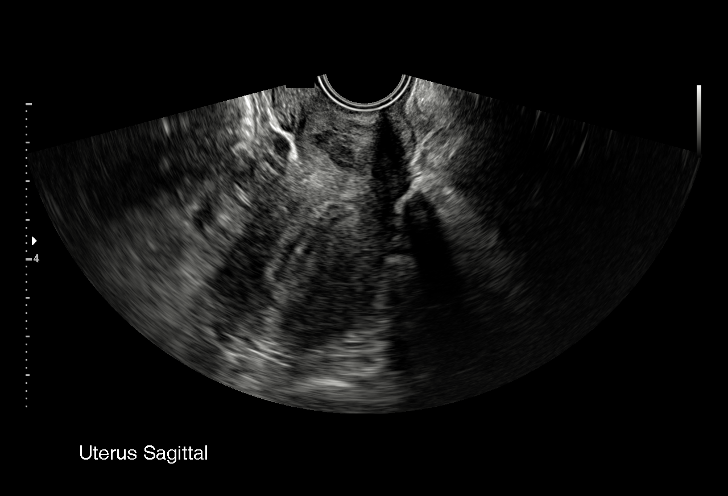
[im 5/30]
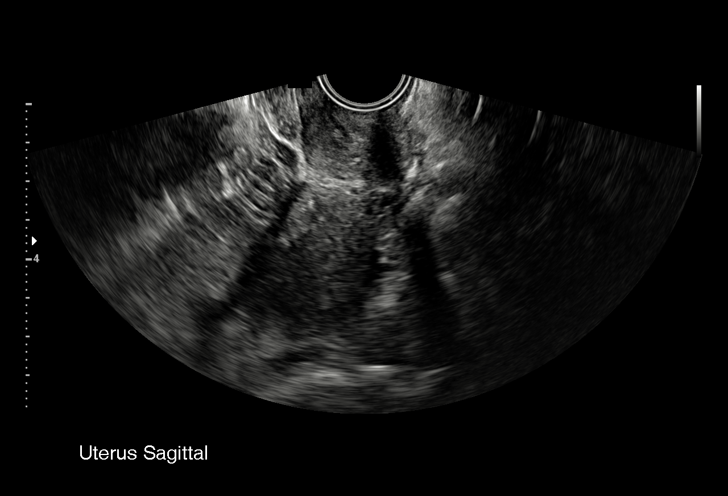
[im 7/30]
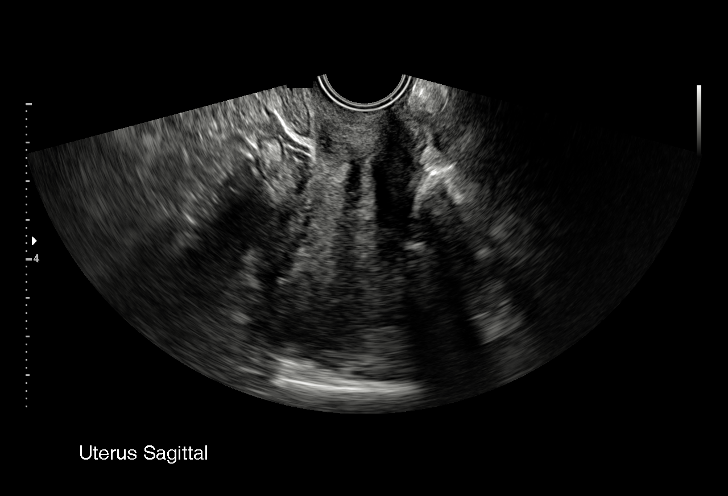
[im 9/30]
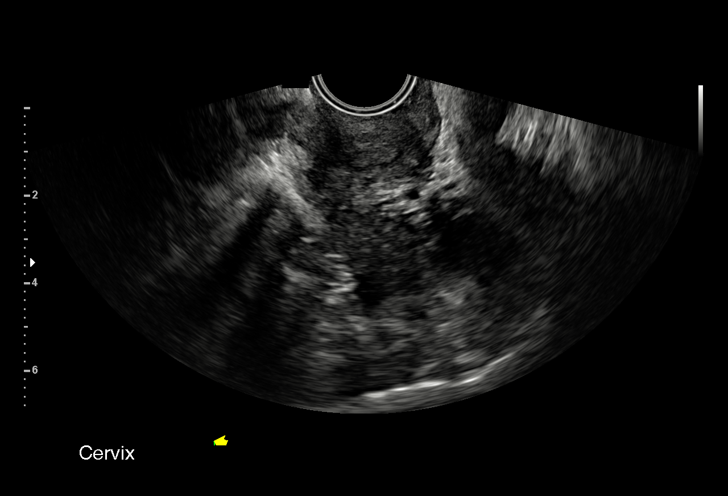
[im 11/30]
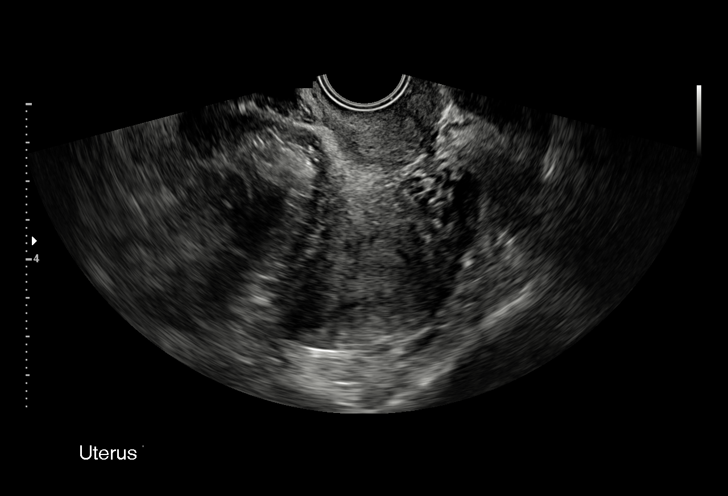
[im 13/30]
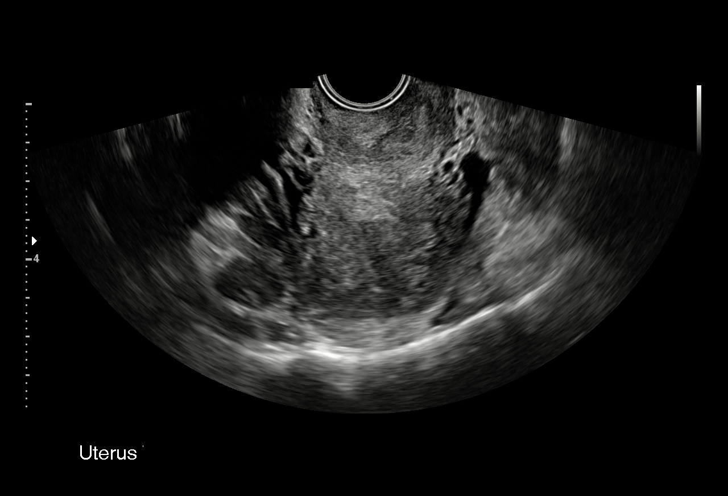
[im 16/30]
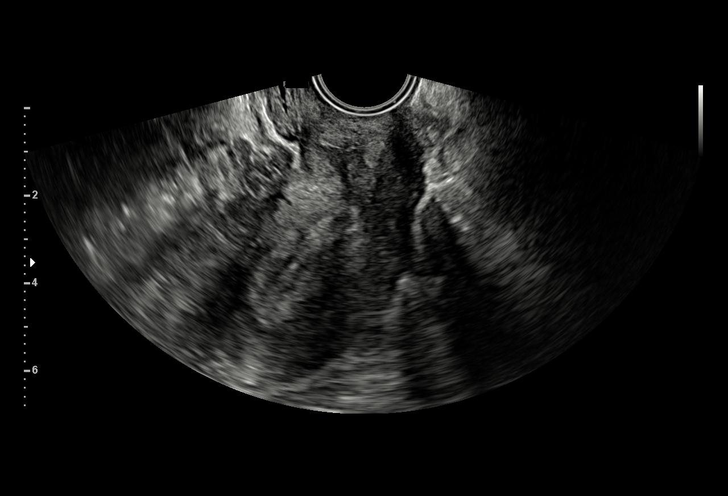
[im 17/30]
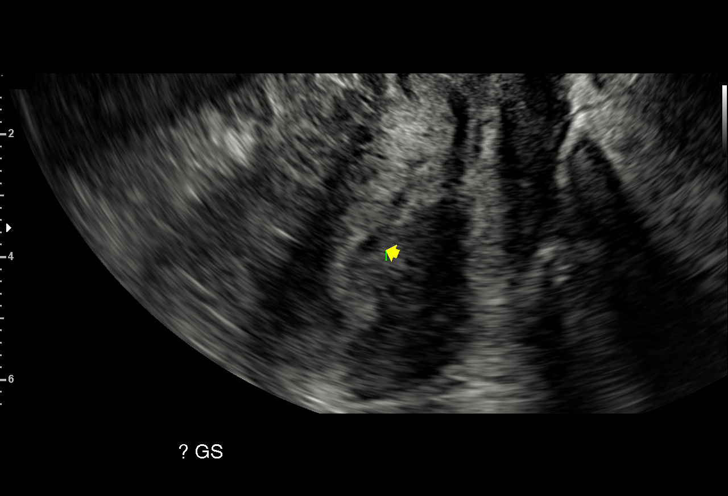
[im 19/30]
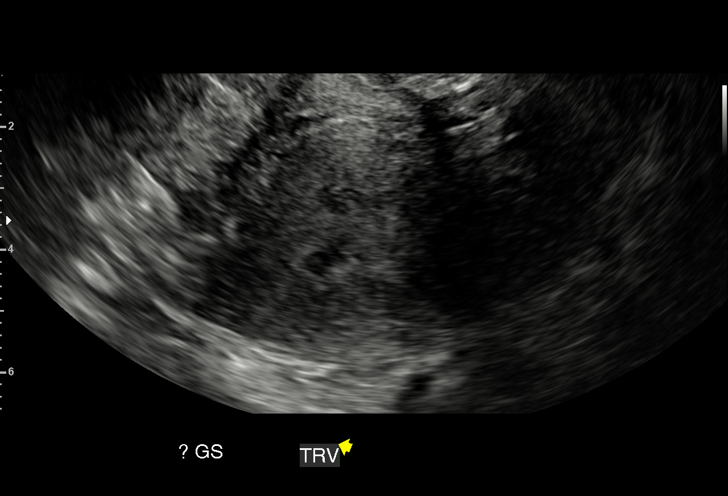
[im 21/30]
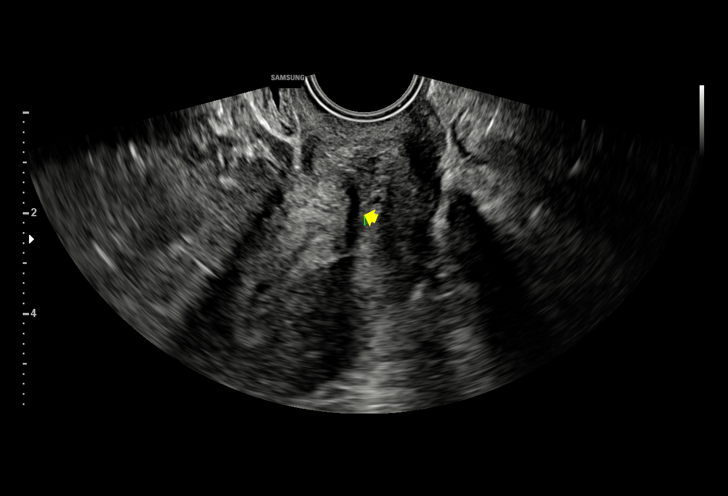
[im 23/30]
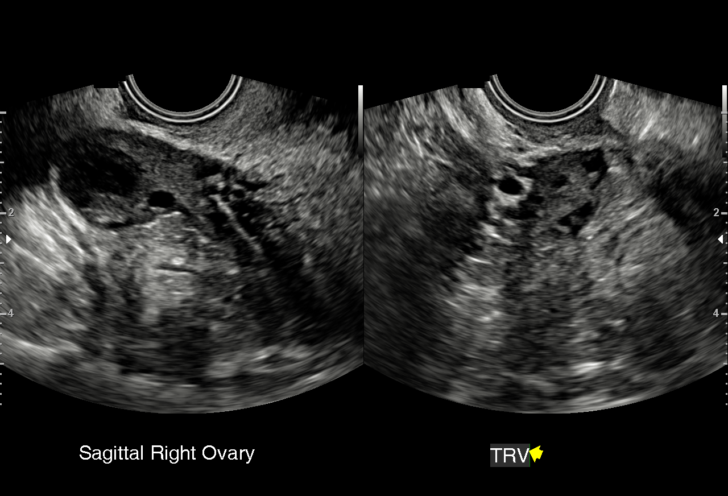
[im 25/30]
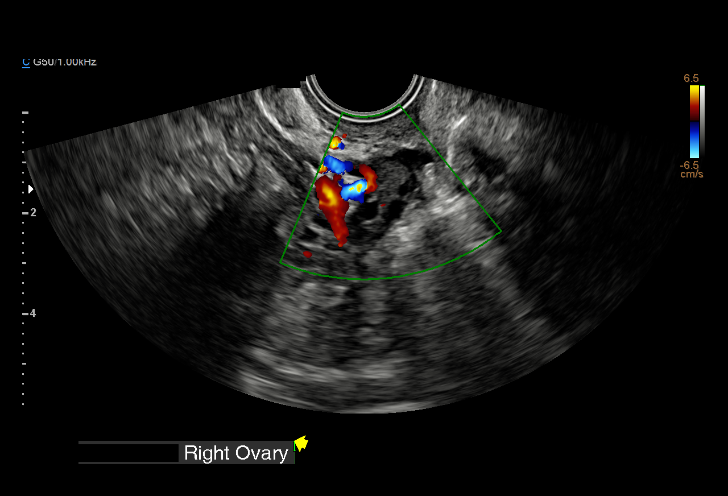
[im 27/30]
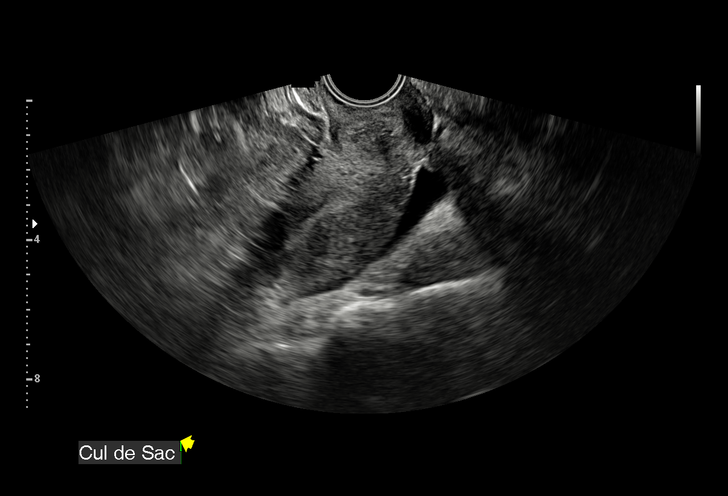
[im 30/30]
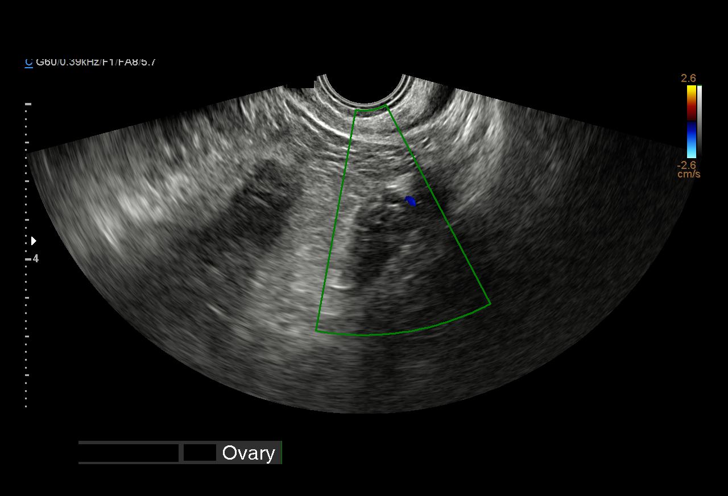

[15 of 28 positions shown; findings below may reference images not displayed]

FINDINGS: Intrauterine gestational sac: A tiny gestational sac is again
demonstrated within the endometrium.

Yolk sac:  Not visualized

Embryo:  Not visualized

MSD: 3.59 mm   5 w   0 d

Subchorionic hemorrhage:  None visualized.

Maternal uterus/adnexae: Normal appearing maternal ovaries with a
right ovarian corpus luteum noted. Small amount of free peritoneal
fluid without internal echoes.
IMPRESSION: 1. A tiny residual intrauterine gestational sac is demonstrated with
an interval mild decrease in size.
2. No visible fetal pole. This is compatible with an aborted
intrauterine gestation without expulsion of the previously
demonstrated tiny gestational sac.
3. Small amount of free peritoneal fluid without evidence of
hemorrhage.

## 2020-09-03 IMAGING — US US OB < 14 WEEKS - US OB TV
1 series · 15 of 28 positions shown · non-contrast
Comparison: None for this pregnancy.

CLINICAL DATA: Back pain in pregnancy. History of ectopic
pregnancy. First trimester pregnancy.

EXAM:
OBSTETRIC <14 WK US AND TRANSVAGINAL OB US
TECHNIQUE: Both transabdominal and transvaginal ultrasound examinations were
performed for complete evaluation of the gestation as well as the
maternal uterus, adnexal regions, and pelvic cul-de-sac.
Transvaginal technique was performed to assess early pregnancy.

[Series 1: us ob < 14 weeks - us ob tv · 15 of 36 slices shown]
[im 1/36]
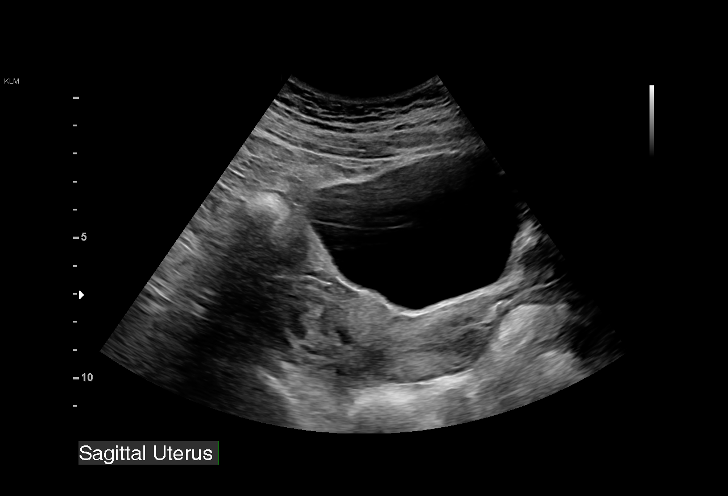
[im 3/36]
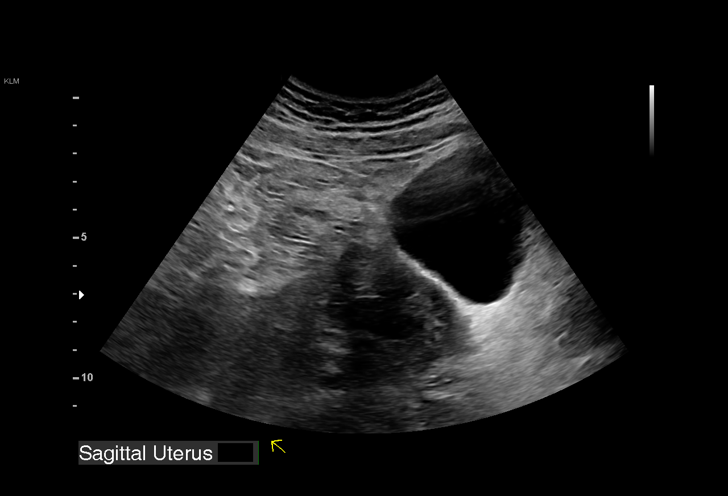
[im 6/36]
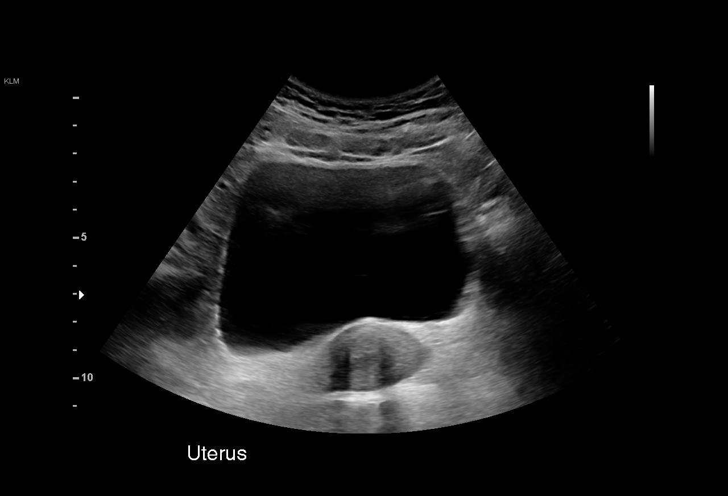
[im 8/36]
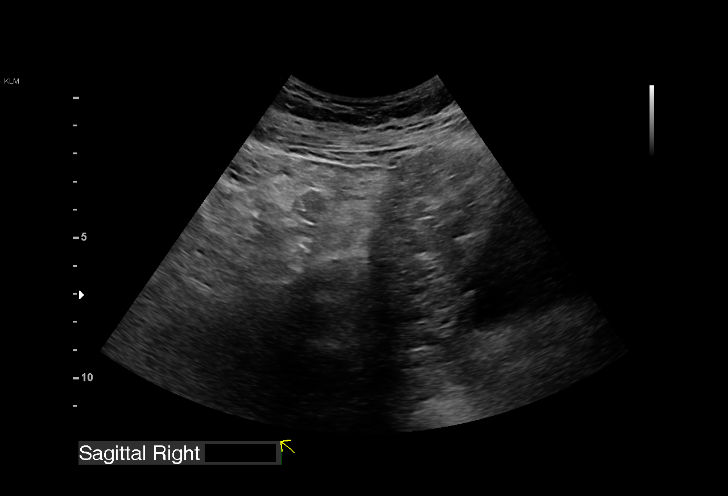
[im 11/36]
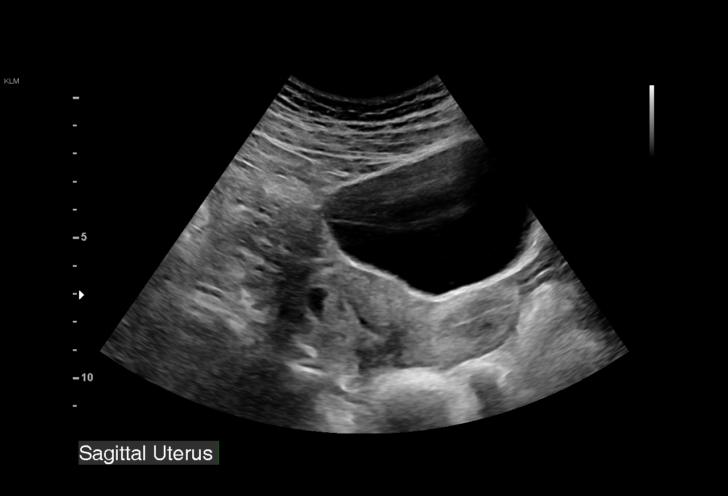
[im 13/36]
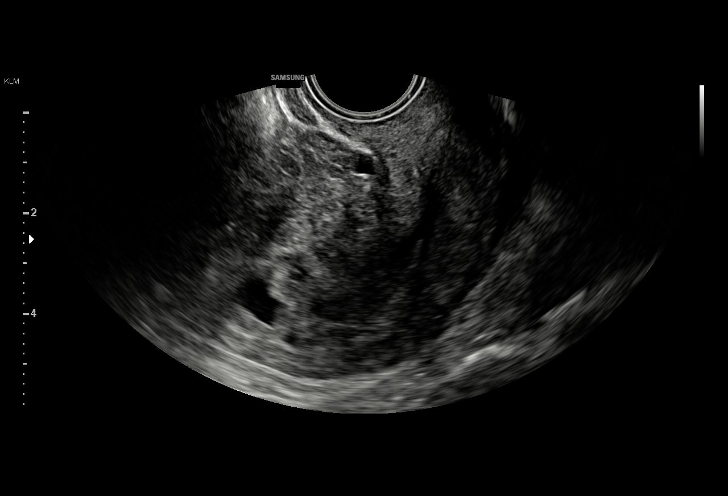
[im 16/36]
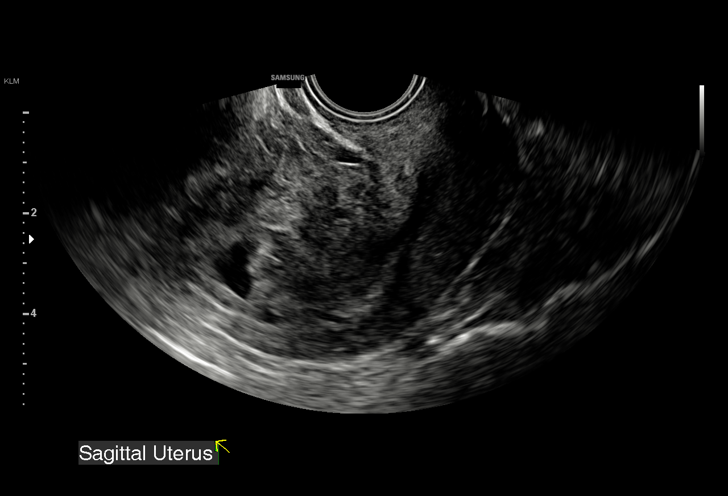
[im 19/36]
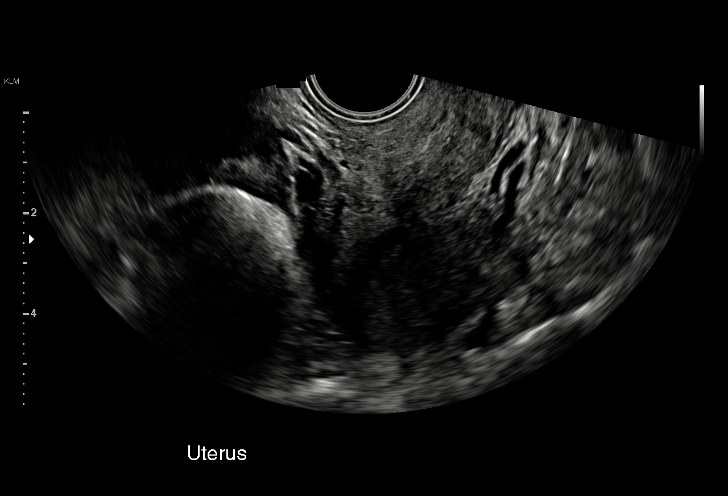
[im 20/36]
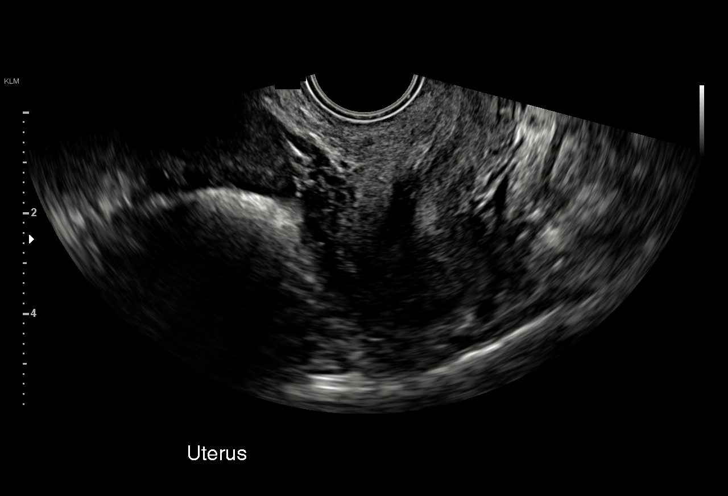
[im 23/36]
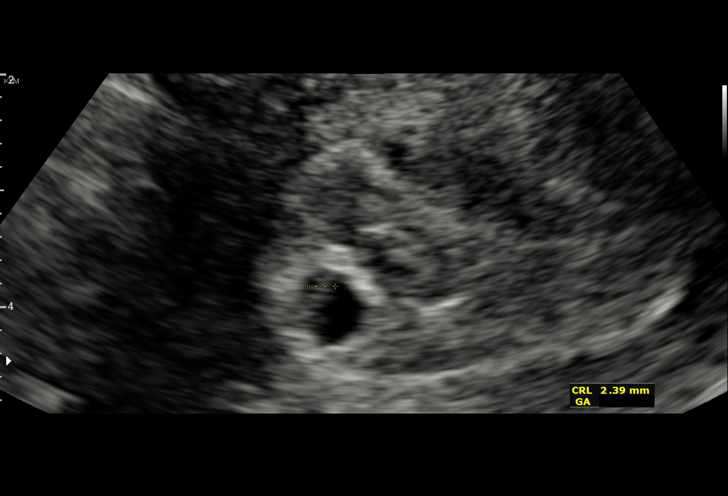
[im 25/36]
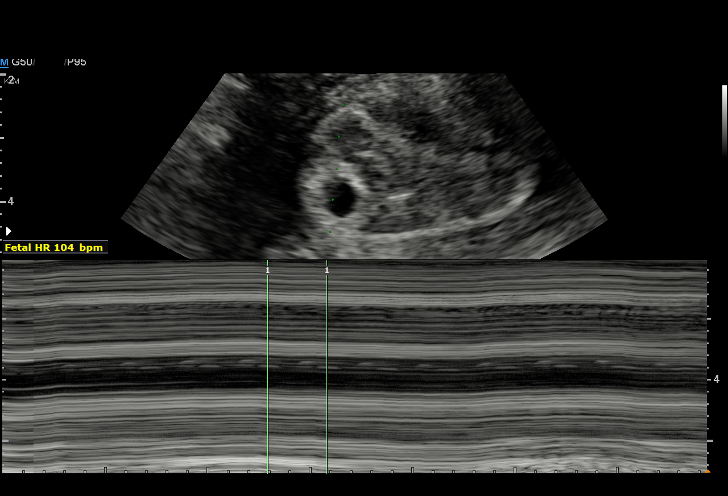
[im 28/36]
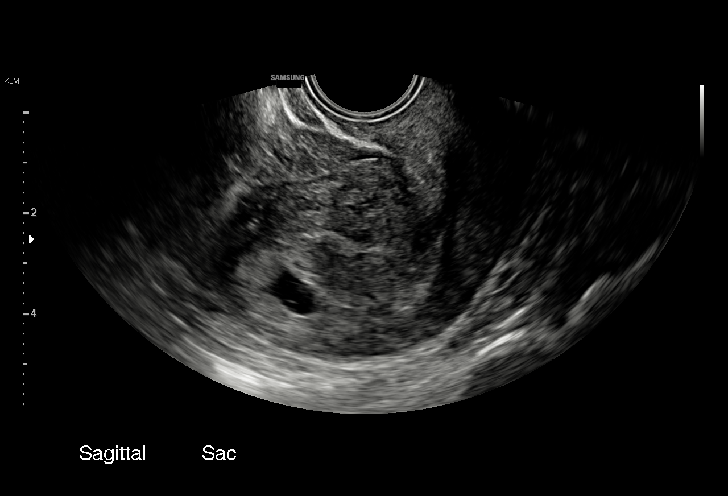
[im 30/36]
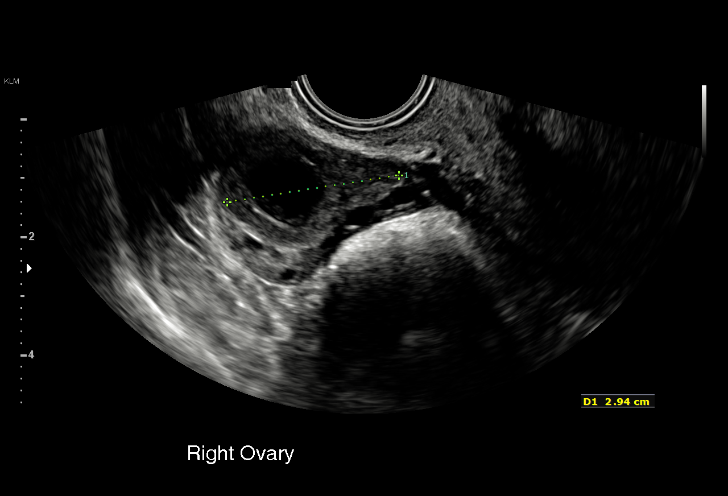
[im 33/36]
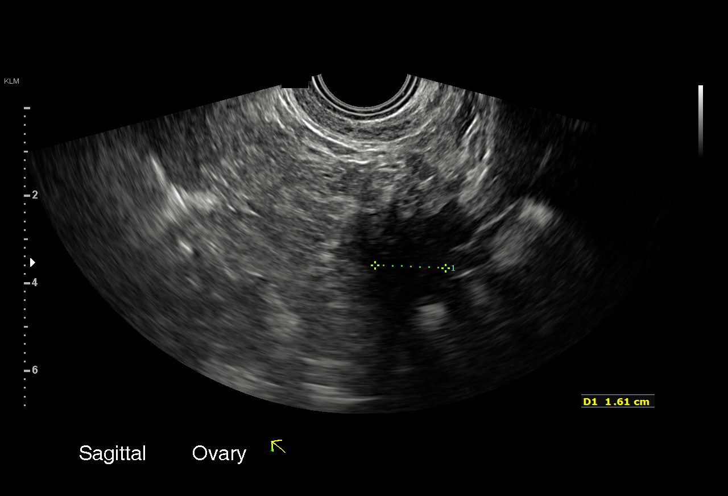
[im 36/36]
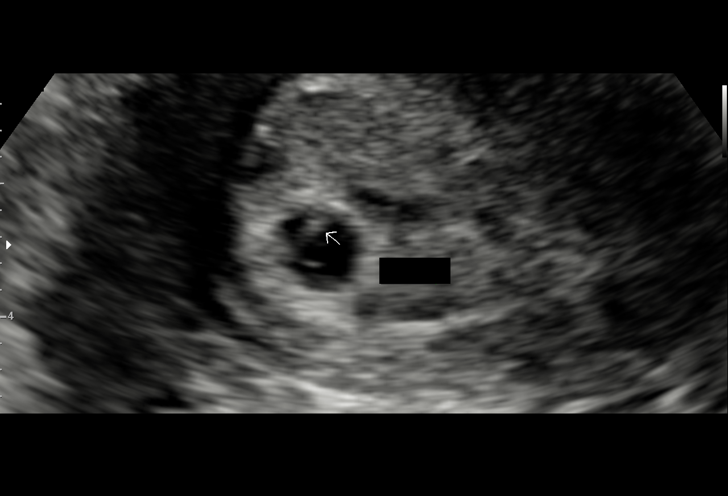

[15 of 28 positions shown; findings below may reference images not displayed]

FINDINGS: Intrauterine gestational sac: Present

Yolk sac:  Present

Embryo:  Present

Cardiac Activity: Present

Heart Rate: 104 bpm

CRL: 2.49 mm   5 w   5 d                  US EDC: 08/10/2020

Subchorionic hemorrhage:  None visualized.

Maternal uterus/adnexae: Unremarkable.  No free fluid is present.
IMPRESSION: 1. Single intrauterine pregnancy.
2. Embryo identified with measurable heart rate of 104 beats per
minute.

## 2020-09-15 IMAGING — US US OB < 14 WEEKS - US OB TV
1 series · 15 of 24 positions shown · non-contrast
Comparison: December 14, 2019

CLINICAL DATA: Pelvic pain and cramping

EXAM:
OBSTETRIC <14 WK US AND TRANSVAGINAL OB US
TECHNIQUE: Both transabdominal and transvaginal ultrasound examinations were
performed for complete evaluation of the gestation as well as the
maternal uterus, adnexal regions, and pelvic cul-de-sac.
Transvaginal technique was performed to assess early pregnancy.

[Series 1: us ob < 14 weeks - us ob tv · 15 of 24 slices shown]
[im 1/24]
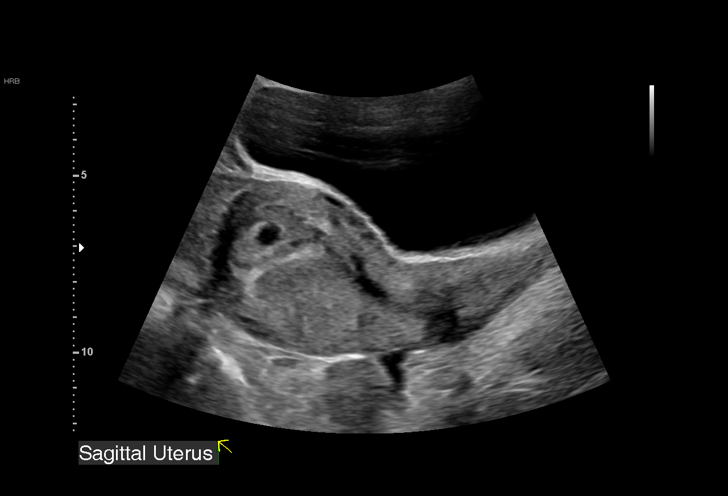
[im 3/24]
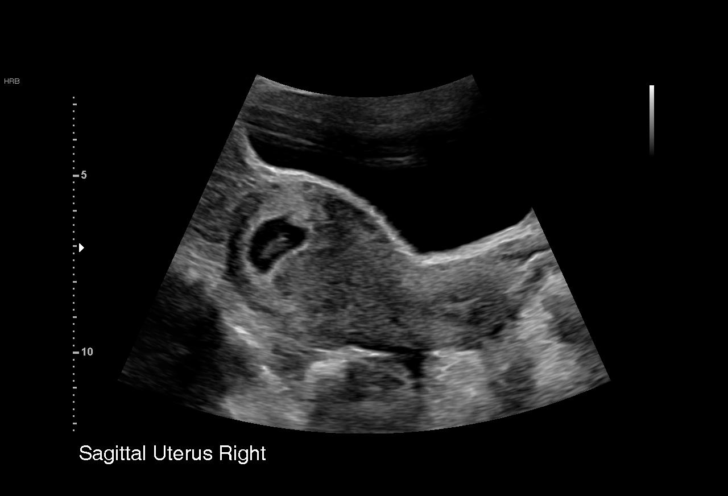
[im 5/24]
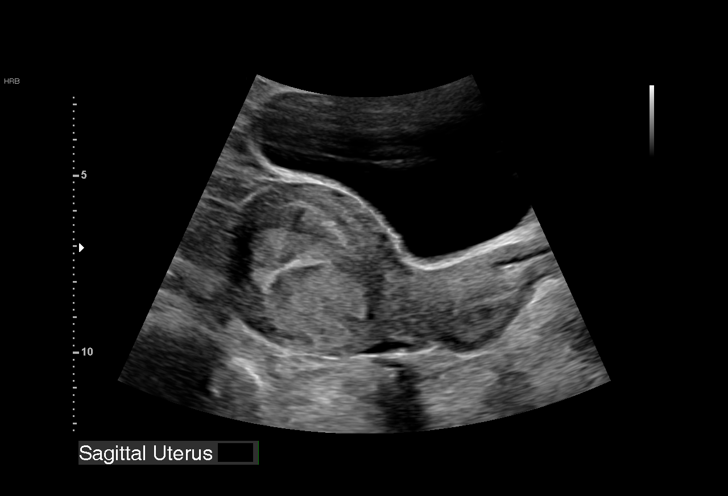
[im 6/24]
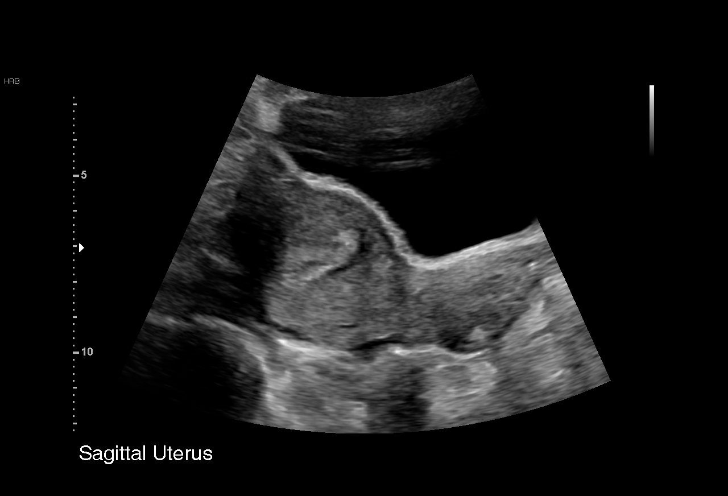
[im 8/24]
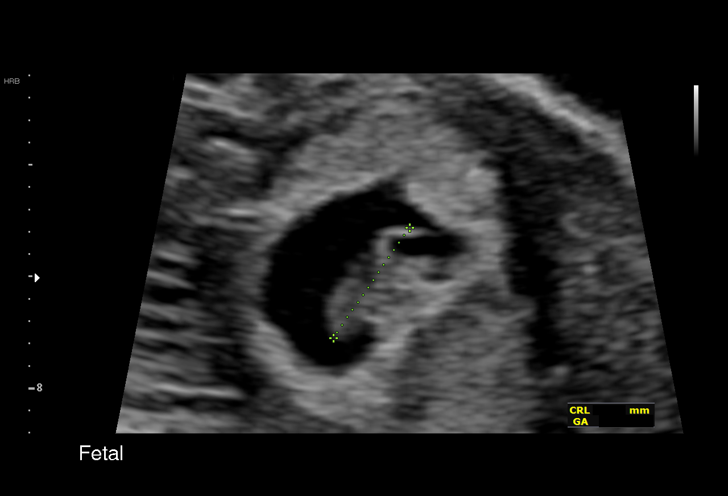
[im 9/24]
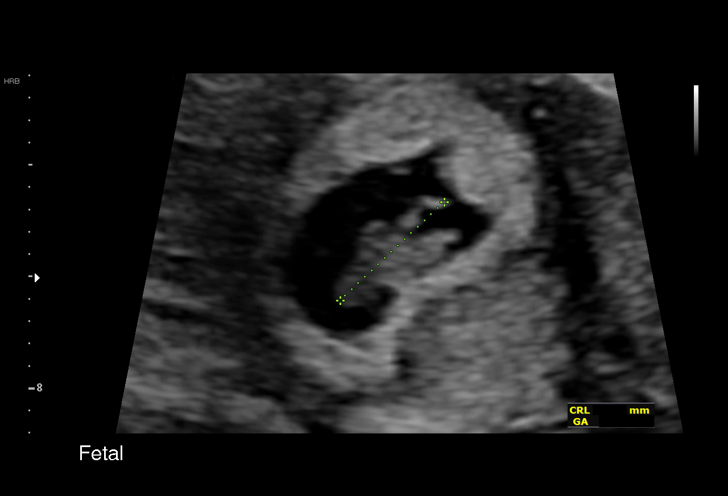
[im 11/24]
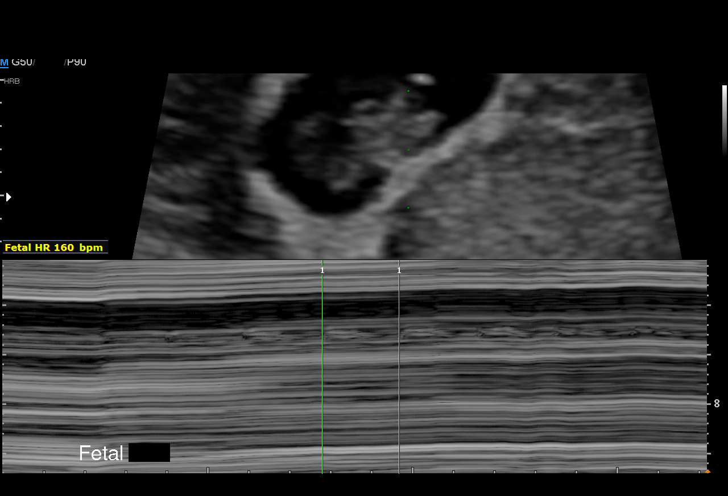
[im 13/24]
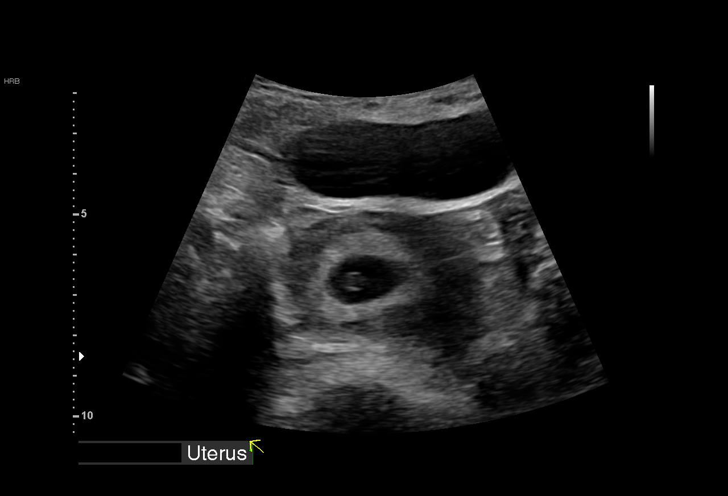
[im 14/24]
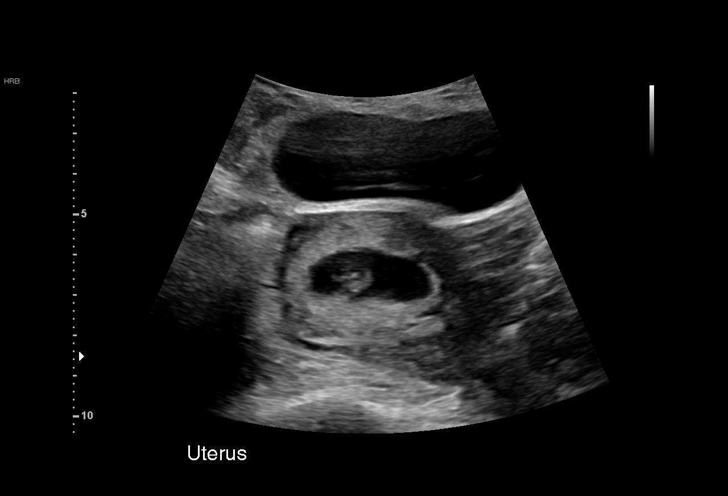
[im 16/24]
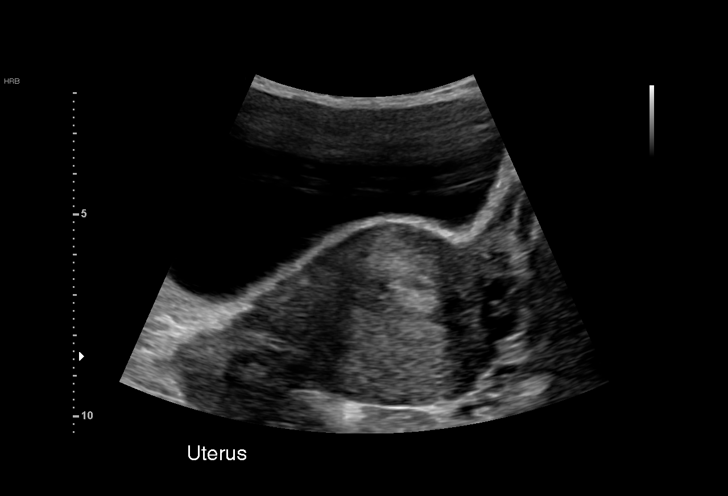
[im 17/24]
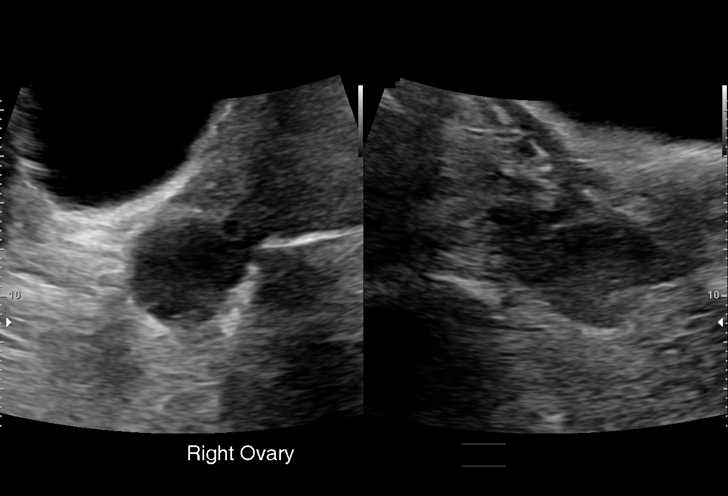
[im 19/24]
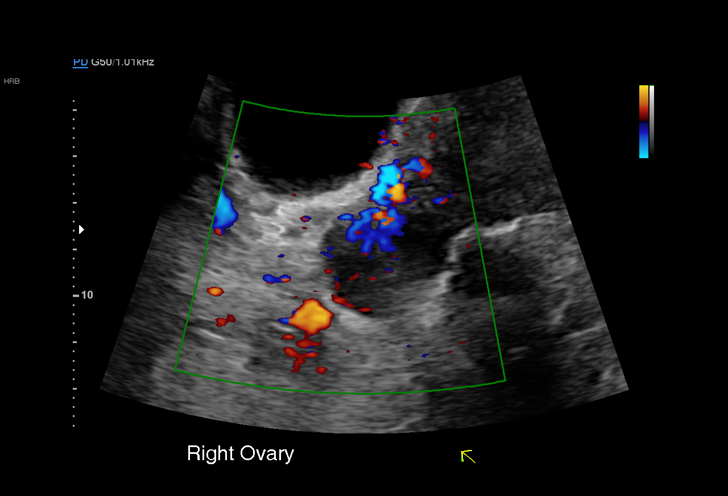
[im 21/24]
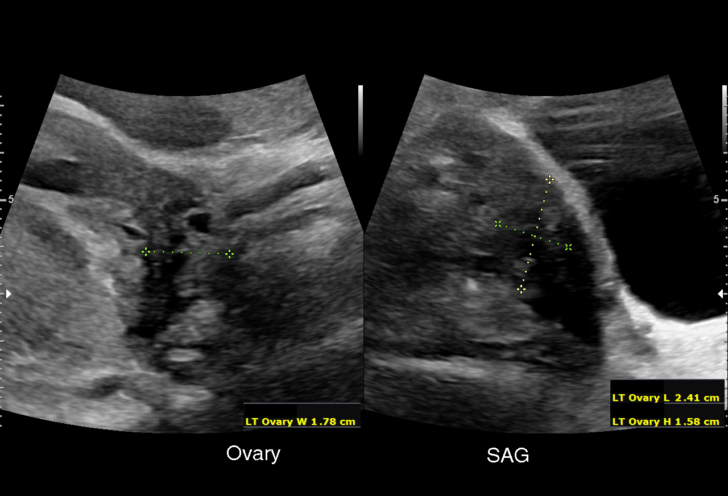
[im 22/24]
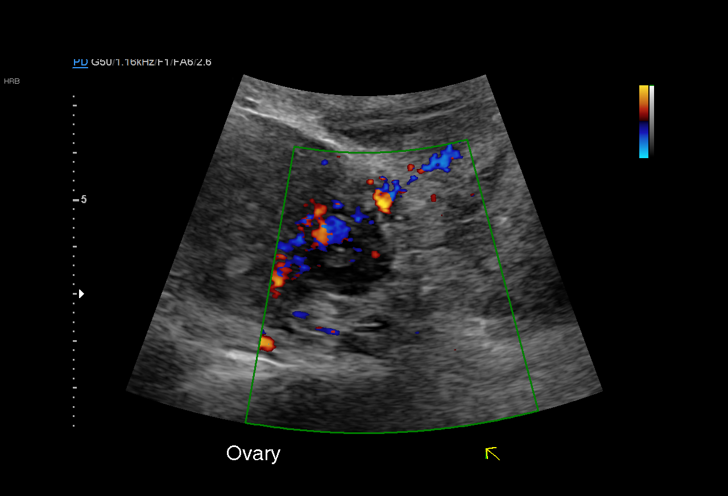
[im 24/24]
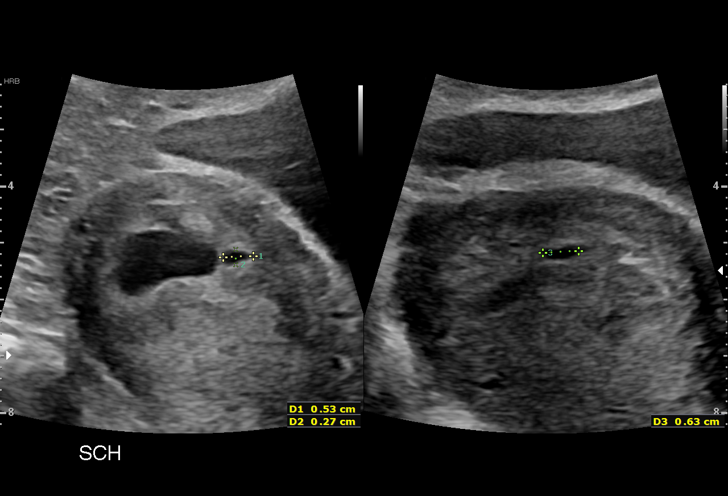

[15 of 24 positions shown; findings below may reference images not displayed]

FINDINGS: Intrauterine gestational sac: Visualized

Yolk sac:  Visualized

Embryo:  Visualized

Cardiac Activity: Visualized

Heart Rate: 160 bpm

CRL:  12 mm   7 w   3 d                  US EDC: August 10, 2020

Subchorionic hemorrhage: There is a subchorionic hemorrhage
measuring 5 x 3 mm.

Maternal uterus/adnexae: Cervical os is closed. Right ovary measures
3.7 x 2.7 x 2.4 cm. Left ovary measures 1.6 x 2.4 x 1.8 cm. There is
no extrauterine pelvic or adnexal mass. No free pelvic fluid.
IMPRESSION: Single live intrauterine gestation with estimated gestational age of
7+ weeks. Subcentimeter subchorionic hemorrhage evident. Study
otherwise unremarkable.

## 2023-05-13 ENCOUNTER — Other Ambulatory Visit: Payer: Self-pay | Admitting: Family

## 2023-05-13 ENCOUNTER — Other Ambulatory Visit: Payer: Self-pay

## 2023-05-13 DIAGNOSIS — R1032 Left lower quadrant pain: Secondary | ICD-10-CM

## 2023-05-13 DIAGNOSIS — R112 Nausea with vomiting, unspecified: Secondary | ICD-10-CM

## 2023-05-14 ENCOUNTER — Ambulatory Visit: Payer: Medicaid Other

## 2023-05-19 ENCOUNTER — Ambulatory Visit
Admission: RE | Admit: 2023-05-19 | Discharge: 2023-05-19 | Disposition: A | Discharge status: Home / Self Care | Payer: Medicaid Other | Source: Ambulatory Visit | Attending: Family | Admitting: Family

## 2023-05-19 DIAGNOSIS — R112 Nausea with vomiting, unspecified: Secondary | ICD-10-CM

## 2023-05-19 DIAGNOSIS — R1032 Left lower quadrant pain: Secondary | ICD-10-CM

## 2023-05-19 MED ORDER — IOPAMIDOL (ISOVUE-300) INJECTION 61%
100.0000 mL | Freq: Once | INTRAVENOUS | Status: AC | PRN
  Administered 2023-05-19: 100 mL via INTRAVENOUS

## 2024-02-18 ENCOUNTER — Emergency Department (HOSPITAL_COMMUNITY)
Admission: EM | Admit: 2024-02-18 | Discharge: 2024-02-18 | Discharge status: Against Medical Advice | Payer: Medicaid Other | Attending: Emergency Medicine | Admitting: Emergency Medicine

## 2024-02-18 ENCOUNTER — Other Ambulatory Visit: Payer: Self-pay

## 2024-02-18 ENCOUNTER — Encounter (HOSPITAL_COMMUNITY): Payer: Self-pay | Admitting: *Deleted

## 2024-02-18 ENCOUNTER — Emergency Department (HOSPITAL_COMMUNITY): Payer: Medicaid Other

## 2024-02-18 DIAGNOSIS — R0602 Shortness of breath: Secondary | ICD-10-CM | POA: Diagnosis not present

## 2024-02-18 DIAGNOSIS — Z5321 Procedure and treatment not carried out due to patient leaving prior to being seen by health care provider: Secondary | ICD-10-CM | POA: Insufficient documentation

## 2024-02-18 DIAGNOSIS — R079 Chest pain, unspecified: Secondary | ICD-10-CM | POA: Insufficient documentation

## 2024-02-18 HISTORY — DX: Disorder of thyroid, unspecified: E07.9

## 2024-02-18 LAB — BASIC METABOLIC PANEL
Anion gap: 9 (ref 5–15)
BUN: 8 mg/dL (ref 6–20)
CO2: 24 mmol/L (ref 22–32)
Calcium: 9.3 mg/dL (ref 8.9–10.3)
Chloride: 105 mmol/L (ref 98–111)
Creatinine, Ser: 0.68 mg/dL (ref 0.44–1.00)
GFR, Estimated: >60 mL/min (ref >60)
Glucose, Bld: 123 mg/dL — ABNORMAL HIGH (ref 70–99)
Potassium: 3.6 mmol/L (ref 3.5–5.1)
Sodium: 138 mmol/L (ref 135–145)

## 2024-02-18 LAB — HCG, SERUM, QUALITATIVE: Preg, Serum: NEGATIVE

## 2024-02-18 LAB — TROPONIN I (HIGH SENSITIVITY)
Troponin I (High Sensitivity): 2 ng/L (ref <18)
Troponin I (High Sensitivity): 2 ng/L (ref <18)

## 2024-02-18 LAB — CBC
HCT: 39.1 % (ref 36.0–46.0)
Hemoglobin: 12.7 g/dL (ref 12.0–15.0)
MCH: 26.1 pg (ref 26.0–34.0)
MCHC: 32.5 g/dL (ref 30.0–36.0)
MCV: 80.3 fL (ref 80.0–100.0)
Platelets: 355 10*3/uL (ref 150–400)
RBC: 4.87 MIL/uL (ref 3.87–5.11)
RDW: 14.1 % (ref 11.5–15.5)
WBC: 8.5 10*3/uL (ref 4.0–10.5)
nRBC: 0 % (ref 0.0–0.2)

## 2024-02-18 NOTE — ED Provider Triage Note (Signed)
 Emergency Medicine Provider Triage Evaluation Note  Victoria Montgomery , a 33 y.o. female  was evaluated in triage.  Pt complains of chest pain, shortness of breath.  Symptoms present for the past couple of days.  Reports chest pain right-sided with some radiation to her left shoulder.  Denies any fever, chills, cough, congestion, abdominal pain.  Denies any personal or family history of cardiac issues..  Review of Systems  Positive: See above Negative:   Physical Exam  BP 108/80   Pulse 99   Temp 98.9 F (37.2 C) (Oral)   Resp 16   LMP 01/13/2024   SpO2 100%  Gen:   Awake, no distress   Resp:  Normal effort  MSK:   Moves extremities without difficulty  Other:    Medical Decision Making  Medically screening exam initiated at 4:56 PM.  Appropriate orders placed.  Victoria Montgomery was informed that the remainder of the evaluation will be completed by another provider, this initial triage assessment does not replace that evaluation, and the importance of remaining in the ED until their evaluation is complete.     Peter Garter, [place] 02/18/24 [phone]

## 2024-02-18 NOTE — ED Notes (Signed)
 Pt states she wants to leave, advised of risks

## 2024-02-18 NOTE — ED Notes (Signed)
 VS retaken at this time. Pt reports cp has improved since check-in

## 2024-02-18 NOTE — ED Triage Notes (Signed)
 Pt is here with chest pain in her central chest which radiates to her left shoulder.  Pain is constant but increases with exertion and is associated with sob.
